# Patient Record
Sex: Male | Born: 1970 | Race: Black or African American | Hispanic: No | Smoking: Current every day smoker
Health system: Southern US, Community
[De-identification: ages and names within clinical notes are randomized; demographics above are authoritative.]

## PROBLEM LIST (undated history)

## (undated) DIAGNOSIS — I10 Essential (primary) hypertension: Secondary | ICD-10-CM

## (undated) DIAGNOSIS — N200 Calculus of kidney: Secondary | ICD-10-CM

## (undated) HISTORY — PX: TOE SURGERY: SHX1073

---

## 2019-04-19 ENCOUNTER — Ambulatory Visit: Payer: Self-pay | Attending: Internal Medicine

## 2019-04-19 DIAGNOSIS — Z23 Encounter for immunization: Secondary | ICD-10-CM

## 2019-04-19 NOTE — Progress Notes (Signed)
   Covid-19 Vaccination Clinic  Name:  Cosme Jacob    MRN: [id number] DOB: January 13, 1971  04/19/2019  Mr. Degroff was observed post Covid-19 immunization for 15 minutes without incident. He was provided with Vaccine Information Sheet and instruction to access the V-Safe system.   Mr. Rampey was instructed to call 911 with any severe reactions post vaccine: Marland Kitchen Difficulty breathing  . Swelling of face and throat  . A fast heartbeat  . A bad rash all over body  . Dizziness and weakness   Immunizations Administered    Name Date Dose VIS Date Route   Pfizer COVID-19 Vaccine 04/19/2019  9:24 AM 0.3 mL 01/12/2019 Intramuscular   Manufacturer: [employer], [employer]   Lot: [id number]   NDC: [address]-7

## 2019-05-16 ENCOUNTER — Ambulatory Visit: Payer: Self-pay | Attending: Internal Medicine

## 2019-05-16 DIAGNOSIS — Z23 Encounter for immunization: Secondary | ICD-10-CM

## 2019-05-16 NOTE — Progress Notes (Signed)
   Covid-19 Vaccination Clinic  Name:  Manuel Mosley    MRN: [id number] DOB: January 05, 1971  05/16/2019  [name] was observed post Covid-19 immunization for 15 minutes without incident. He was provided with Vaccine Information Sheet and instruction to access the V-Safe system.   [name] was instructed to call 911 with any severe reactions post vaccine: Marland Kitchen Difficulty breathing  . Swelling of face and throat  . A fast heartbeat  . A bad rash all over body  . Dizziness and weakness   Immunizations Administered    Name Date Dose VIS Date Route   Pfizer COVID-19 Vaccine 05/16/2019  3:52 PM 0.3 mL 01/12/2019 Intramuscular   Manufacturer: [employer], [employer]   Lot: [id number]   NDC: [address]-2

## 2019-07-19 ENCOUNTER — Other Ambulatory Visit: Payer: Self-pay

## 2019-07-19 ENCOUNTER — Observation Stay (HOSPITAL_COMMUNITY)
Admission: EM | Admit: 2019-07-19 | Discharge: 2019-07-21 | Disposition: A | Discharge status: Home / Self Care | Payer: Managed Care, Other (non HMO) | Attending: Internal Medicine | Admitting: Internal Medicine

## 2019-07-19 ENCOUNTER — Encounter (HOSPITAL_COMMUNITY): Payer: Self-pay

## 2019-07-19 ENCOUNTER — Emergency Department (HOSPITAL_COMMUNITY): Payer: Managed Care, Other (non HMO)

## 2019-07-19 ENCOUNTER — Ambulatory Visit (HOSPITAL_COMMUNITY)
Admission: EM | Admit: 2019-07-19 | Discharge: 2019-07-19 | Disposition: A | Discharge status: Other Acute Inpatient Hospital | Payer: Managed Care, Other (non HMO) | Source: Home / Self Care

## 2019-07-19 ENCOUNTER — Encounter (HOSPITAL_COMMUNITY): Payer: Self-pay | Admitting: Internal Medicine

## 2019-07-19 DIAGNOSIS — Z20822 Contact with and (suspected) exposure to covid-19: Secondary | ICD-10-CM | POA: Insufficient documentation

## 2019-07-19 DIAGNOSIS — I493 Ventricular premature depolarization: Secondary | ICD-10-CM | POA: Insufficient documentation

## 2019-07-19 DIAGNOSIS — I160 Hypertensive urgency: Principal | ICD-10-CM | POA: Diagnosis present

## 2019-07-19 DIAGNOSIS — R079 Chest pain, unspecified: Secondary | ICD-10-CM | POA: Diagnosis present

## 2019-07-19 DIAGNOSIS — I2510 Atherosclerotic heart disease of native coronary artery without angina pectoris: Secondary | ICD-10-CM | POA: Diagnosis not present

## 2019-07-19 DIAGNOSIS — F17210 Nicotine dependence, cigarettes, uncomplicated: Secondary | ICD-10-CM | POA: Insufficient documentation

## 2019-07-19 HISTORY — DX: Calculus of kidney: N20.0

## 2019-07-19 HISTORY — DX: Essential (primary) hypertension: I10

## 2019-07-19 LAB — CBC
HCT: 52 % (ref 39.0–52.0)
HCT: 49 % (ref 39.0–52.0)
Hemoglobin: 16.8 g/dL (ref 13.0–17.0)
Hemoglobin: 15.9 g/dL (ref 13.0–17.0)
MCH: 28.7 pg (ref 26.0–34.0)
MCH: 28.8 pg (ref 26.0–34.0)
MCHC: 32.3 g/dL (ref 30.0–36.0)
MCHC: 32.4 g/dL (ref 30.0–36.0)
MCV: 88.9 fL (ref 80.0–100.0)
MCV: 88.8 fL (ref 80.0–100.0)
Platelets: 237 10*3/uL (ref 150–400)
Platelets: 213 10*3/uL (ref 150–400)
RBC: 5.85 MIL/uL — ABNORMAL HIGH (ref 4.22–5.81)
RBC: 5.52 MIL/uL (ref 4.22–5.81)
RDW: 13.2 % (ref 11.5–15.5)
RDW: 13.3 % (ref 11.5–15.5)
WBC: 7.1 10*3/uL (ref 4.0–10.5)
WBC: 7.7 10*3/uL (ref 4.0–10.5)
nRBC: 0 % (ref 0.0–0.2)
nRBC: 0 % (ref 0.0–0.2)

## 2019-07-19 LAB — RAPID URINE DRUG SCREEN, HOSP PERFORMED
Amphetamines: NOT DETECTED
Barbiturates: NOT DETECTED
Benzodiazepines: NOT DETECTED
Cocaine: NOT DETECTED
Opiates: NOT DETECTED
Tetrahydrocannabinol: POSITIVE — AB

## 2019-07-19 LAB — BASIC METABOLIC PANEL
Anion gap: 9 (ref 5–15)
BUN: 8 mg/dL (ref 6–20)
CO2: 28 mmol/L (ref 22–32)
Calcium: 9.3 mg/dL (ref 8.9–10.3)
Chloride: 104 mmol/L (ref 98–111)
Creatinine, Ser: 0.95 mg/dL (ref 0.61–1.24)
GFR calc Af Amer: >60 mL/min (ref >60)
GFR calc non Af Amer: >60 mL/min (ref >60)
Glucose, Bld: 96 mg/dL (ref 70–99)
Potassium: 3.9 mmol/L (ref 3.5–5.1)
Sodium: 141 mmol/L (ref 135–145)

## 2019-07-19 LAB — TROPONIN I (HIGH SENSITIVITY)
Troponin I (High Sensitivity): 35 ng/L — ABNORMAL HIGH (ref <18)
Troponin I (High Sensitivity): 33 ng/L — ABNORMAL HIGH (ref <18)

## 2019-07-19 LAB — CREATININE, SERUM
Creatinine, Ser: 1.01 mg/dL (ref 0.61–1.24)
GFR calc Af Amer: >60 mL/min (ref >60)
GFR calc non Af Amer: >60 mL/min (ref >60)

## 2019-07-19 LAB — SARS CORONAVIRUS 2 BY RT PCR (HOSPITAL ORDER, PERFORMED IN ~~LOC~~ HOSPITAL LAB): SARS Coronavirus 2: NEGATIVE

## 2019-07-19 LAB — HIV ANTIBODY (ROUTINE TESTING W REFLEX): HIV Screen 4th Generation wRfx: NONREACTIVE

## 2019-07-19 MED ORDER — HYDRALAZINE HCL 20 MG/ML IJ SOLN
10.0000 mg | INTRAMUSCULAR | Status: DC | PRN
  Administered 2019-07-19 – 2019-07-20 (×2): 10 mg via INTRAVENOUS
  Filled 2019-07-19 (×2): qty 1

## 2019-07-19 MED ORDER — ACETAMINOPHEN 325 MG PO TABS: 650.0000 mg | ORAL_TABLET | ORAL | Status: DC | PRN

## 2019-07-19 MED ORDER — HYDROCHLOROTHIAZIDE 12.5 MG PO CAPS
12.5000 mg | ORAL_CAPSULE | Freq: Once | ORAL | Status: AC
  Administered 2019-07-19: 12.5 mg via ORAL
  Filled 2019-07-19: qty 1

## 2019-07-19 MED ORDER — ENOXAPARIN SODIUM 40 MG/0.4ML ~~LOC~~ SOLN
40.0000 mg | SUBCUTANEOUS | Status: DC
  Filled 2019-07-19: qty 0.4

## 2019-07-19 MED ORDER — ASPIRIN EC 81 MG PO TBEC
81.0000 mg | DELAYED_RELEASE_TABLET | Freq: Every day | ORAL | Status: DC
  Administered 2019-07-19 – 2019-07-21 (×3): 81 mg via ORAL
  Filled 2019-07-19 (×3): qty 1

## 2019-07-19 MED ORDER — AMLODIPINE BESYLATE 5 MG PO TABS
5.0000 mg | ORAL_TABLET | Freq: Every day | ORAL | Status: DC
  Administered 2019-07-19 – 2019-07-21 (×3): 5 mg via ORAL
  Filled 2019-07-19 (×3): qty 1

## 2019-07-19 MED ORDER — CHLORTHALIDONE 25 MG PO TABS
25.0000 mg | ORAL_TABLET | Freq: Every day | ORAL | Status: DC
  Administered 2019-07-20 – 2019-07-21 (×2): 25 mg via ORAL
  Filled 2019-07-19 (×2): qty 1

## 2019-07-19 MED ORDER — ONDANSETRON HCL 4 MG/2ML IJ SOLN: 4.0000 mg | Freq: Four times a day (QID) | INTRAMUSCULAR | Status: DC | PRN

## 2019-07-19 NOTE — ED Notes (Signed)
Patient is being discharged from the Urgent Care and sent to the Emergency Department via POV . Per Gloris Manchester, NP, patient is in need of higher level of care due to active chest pain. Patient is aware and verbalizes understanding of plan of care.  Vitals:   07/19/19 0945  BP: (!) 178/121  Pulse: 78  Resp: 16  Temp: 98.8 F (37.1 C)  SpO2: 100%

## 2019-07-19 NOTE — ED Triage Notes (Signed)
Pt c/o 5/10 non radiating chest heaviness in right and left chest that started at 0730 today. Pt c/o HA. Pt denies N/V, blurred vision, SOB. Pt has non labored breathing. Skin color WNL.

## 2019-07-19 NOTE — ED Triage Notes (Signed)
Pt sent here from UC for further eval of chest pain that developed as he was driving to work this morning. After arriving to work developed a headache. Denies n/v or shob. Pt reports he was sent here d/t his blood pressure and an abnormal EKG.

## 2019-07-19 NOTE — ED Notes (Signed)
Admitting at bedside 

## 2019-07-19 NOTE — ED Provider Notes (Signed)
Manuel Mos[facility] EMERGENCY DEPARTMENT Provider Note   CSN: [id number] Arrival date & time: 07/19/19  1002     History Chief Complaint  Patient presents with  . Chest Pain  . Abnormal ECG    Icholas Mosley is a 49 y.o. male.  HPI      Manuel Mosley is a 49 y.o. male, with a history of HTN, presenting to the ED with chest pain.  Patient states while driving to work around 1:65 AM this morning, he began to feel central chest pressure that lasted for approximately 25 minutes before resolving.  Pain was moderate, nonradiating. Shortly thereafter, he began to experience a headache that felt like a pressure, moderate to severe, nonradiating.  This also resolved spontaneously and did not recur. He notes he thinks his symptoms came on because of stress and his high blood pressure. He was previously on medication for his hypertension, however, has not been on any medications since 2019 due to loss of insurance.  He has an upcoming appointment with a PCP July 14.  Denies vision changes, neck pain, other neurologic deficits, syncope, dizziness, shortness of breath, diaphoresis, nausea/vomiting, abdominal pain, back pain, or any other complaints.     Past Medical History:  Diagnosis Date  . Hypertension   . Kidney stones     Patient Active Problem List   Diagnosis Date Noted  . Chest pain 07/19/2019    Past Surgical History:  Procedure Laterality Date  . TOE SURGERY Right        No family history on file.  Social History   Tobacco Use  . Smoking status: Current Every Day Smoker    Packs/day: 1.00    Types: Cigarettes  Substance Use Topics  . Alcohol use: Yes    Comment: occ  . Drug use: Yes    Types: Marijuana    Home Medications Prior to Admission medications   Not on File    Allergies    Patient has no known allergies.  Review of Systems   Review of Systems  Constitutional: Negative for diaphoresis.  Eyes: Negative for visual disturbance.    Respiratory: Negative for shortness of breath.   Cardiovascular: Positive for chest pain (resolved). Negative for palpitations.  Gastrointestinal: Negative for abdominal pain, diarrhea, nausea and vomiting.  Musculoskeletal: Negative for back pain.  Neurological: Positive for headaches (resolved). Negative for dizziness, syncope, weakness, light-headedness and numbness.  All other systems reviewed and are negative.   Physical Exam Updated Vital Signs BP (!) 163/119   Pulse 64   Temp 98.5 F (36.9 C) (Oral)   Resp 16   Ht 5\' 11"  (1.803 m)   Wt 106.1 kg   SpO2 100%   BMI 32.64 kg/m   Physical Exam Vitals and nursing note reviewed.  Constitutional:      General: He is not in acute distress.    Appearance: He is well-developed. He is not diaphoretic.  HENT:     Head: Normocephalic and atraumatic.     Mouth/Throat:     Mouth: Mucous membranes are moist.     Pharynx: Oropharynx is clear.  Eyes:     Conjunctiva/sclera: Conjunctivae normal.  Cardiovascular:     Rate and Rhythm: Normal rate and regular rhythm.     Pulses: Normal pulses.          Radial pulses are 2+ on the right side and 2+ on the left side.       Posterior tibial pulses are 2+ on the  right side and 2+ on the left side.     Heart sounds: Normal heart sounds.     Comments: Tactile temperature in the extremities appropriate and equal bilaterally. Pulmonary:     Effort: Pulmonary effort is normal. No respiratory distress.     Breath sounds: Normal breath sounds.  Abdominal:     Palpations: Abdomen is soft.     Tenderness: There is no abdominal tenderness. There is no guarding.  Musculoskeletal:     Cervical back: Neck supple.     Right lower leg: No edema.     Left lower leg: No edema.  Lymphadenopathy:     Cervical: No cervical adenopathy.  Skin:    General: Skin is warm and dry.  Neurological:     Mental Status: He is alert.  Psychiatric:        Mood and Affect: Mood and affect normal.        Speech:  Speech normal.        Behavior: Behavior normal.     ED Results / Procedures / Treatments   Labs (all labs ordered are listed, but only abnormal results are displayed) Labs Reviewed  CBC - Abnormal; Notable for the following components:      Result Value   RBC 5.85 (*)    All other components within normal limits  TROPONIN I (HIGH SENSITIVITY) - Abnormal; Notable for the following components:   Troponin I (High Sensitivity) 35 (*)    All other components within normal limits  TROPONIN I (HIGH SENSITIVITY) - Abnormal; Notable for the following components:   Troponin I (High Sensitivity) 33 (*)    All other components within normal limits  SARS CORONAVIRUS 2 BY RT PCR (HOSPITAL ORDER, [facility])  BASIC METABOLIC PANEL    EKG EKG Interpretation  Date/Time:  Thursday July 19 2019 10:00:21 EDT Ventricular Rate:  94 PR Interval:  166 QRS Duration: 90 QT Interval:  360 QTC Calculation: 450 R Axis:   -17 Text Interpretation: Sinus rhythm with sinus arrhythmia with occasional Premature ventricular complexes Otherwise normal ECG When compared to prior, similar PVCs present. No STEMI Confirmed by Antony Blackbird [phone]) on 07/19/2019 3:46:49 PM   Radiology DG Chest 2 View  Result Date: 07/19/2019 CLINICAL DATA:  Chest pain. EXAM: CHEST - 2 VIEW COMPARISON:  None. FINDINGS: The heart size and mediastinal contours are within normal limits. Both lungs are clear. No pneumothorax or pleural effusion is noted. The visualized skeletal structures are unremarkable. IMPRESSION: No active cardiopulmonary disease. Electronically Signed   By: Marijo Conception M.D.   On: 07/19/2019 10:43    Procedures Procedures (including critical care time)  Medications Ordered in ED Medications  hydrochlorothiazide (MICROZIDE) capsule 12.5 mg (12.5 mg Oral Given 07/19/19 1731)    ED Course  I have reviewed the triage vital signs and the nursing notes.  Pertinent labs & imaging  results that were available during my care of the patient were reviewed by me and considered in my medical decision making (see chart for details).  Clinical Course as of Jul 19 2023  Thu Jul 19, 2019  1802 Spoke with Dr. Margaretann Loveless, cardiologist.  Advises to admit the patient for observation through medicine service.  They will likely see the patient in the morning.  Continue to trend troponins and repeat EKGs overnight.   [SJ]  [place] with Dr. Hal Hope, hospitalist. Agrees to admit the patient.    [SJ]    Clinical Course User  Index [SJ] Bethenny Losee, Hillard Danker, PA-C   MDM Rules/Calculators/A&P                          Patient presents for evaluation following an episode of chest pain this morning. Patient is nontoxic appearing, afebrile, not tachycardic, not tachypneic, not hypotensive, maintains excellent SPO2 on room air, and is in no apparent distress.   I have reviewed the patient's chart to obtain more information.   I reviewed and interpreted the patient's labs and radiological studies. Patient has a mild troponin increase, flat across trended troponins. Mildly abnormal EKG without previous EKG with which to compare. Patient admitted for chest pain observation.  Findings and plan of care discussed with Theda Belfast, MD.   Vitals:   07/19/19 1159 07/19/19 1628 07/19/19 1732 07/19/19 1801  BP: (!) 163/119 (!) 178/130 (!) 152/107 (!) 154/98  Pulse: 64 77 92 64  Resp: 16 14 (!) 21 14  Temp:      TempSrc:      SpO2: 100% 96% 97% 98%  Weight:      Height:         Final Clinical Impression(s) / ED Diagnoses Final diagnoses:  Hypertensive urgency    Rx / DC Orders ED Discharge Orders    None       Concepcion Living 07/19/19 2030    Tegeler, Canary Brim, MD 07/20/19 0021

## 2019-07-19 NOTE — H&P (Addendum)
History and Physical    Manuel Mosley ERX:[id number] DOB: 1970-03-10 DOA: 07/19/2019  PCP: Patient, No Pcp Per  Patient coming from: Home.  Chief Complaint: Chest pain headache.  HPI: Manuel Mosley is a 49 y.o. male with history of hypertension tobacco abuse presents to the ER tech patient started having chest pain and headache this morning.  Patient stated the chest pain was retrosternal pressure-like lasted for 20 minutes resolved without any intervention.  At the same time patient also had a frontal headache for most however had to have which also resolved without any intervention.  Denies any shortness of breath productive cough fever chills or any focal deficits.  Patient states he has a history of hypertension and was on medication until 2 years ago when he lost his job and his insurance.  ED Course: In the ER EKG shows sinus rhythm high sensitive troponins were around 34 and 35.  ER physician discussed with on-call cardiology who recommended admission for observation.  Presently chest pain-free.  In addition patient blood pressure is found to be markedly elevated with systolic blood pressure of 1 82-3 116.  Patient was given hydrochlorothiazide and I have added amlodipine.  We will keep patient as needed IV hydralazine admitted for further management.  Covid test was negative.  Complete metabolic panel and CBC were unremarkable.  Review of Systems: As per HPI, rest all negative.   Past Medical History:  Diagnosis Date  . Hypertension   . Kidney stones     Past Surgical History:  Procedure Laterality Date  . TOE SURGERY Right      reports that he has been smoking cigarettes. He has been smoking about 1.00 pack per day. He has never used smokeless tobacco. He reports current alcohol use. He reports current drug use. Drug: Marijuana.  No Known Allergies  Family History  Problem Relation Age of Onset  . Breast cancer Mother   . Diabetes Mellitus II Mother   . Cancer Father      Prior to Admission medications   Not on File    Physical Exam: Constitutional: Moderately built and nourished. Vitals:   07/19/19 1916 07/19/19 2002 07/19/19 2100 07/19/19 2127  BP: (!) 170/127 (!) 163/121 (!) 170/111 (!) 170/111  Pulse: 69 61 72   Resp: (!) 23 16 17    Temp:      TempSrc:      SpO2: 98% 97% 95%   Weight:      Height:       Eyes: Anicteric no pallor. ENMT: No discharge from the ears eyes nose or mouth. Neck: No mass felt.  No neck rigidity. Respiratory: No rhonchi or crepitations. Cardiovascular: S1-S2 heard. Abdomen: Soft nontender bowel sounds present. Musculoskeletal: No edema. Skin: No rash. Neurologic: Alert awake oriented to time place and person.  Moves all extremities. Psychiatric: Appears normal.  Normal affect.   Labs on Admission: I have personally reviewed following labs and imaging studies  CBC: Recent Labs  Lab 07/19/19 1017  WBC 7.1  HGB 16.8  HCT 52.0  MCV 88.9  PLT 237   Basic Metabolic Panel: Recent Labs  Lab 07/19/19 1017  NA 141  K 3.9  CL 104  CO2 28  GLUCOSE 96  BUN 8  CREATININE 0.95  CALCIUM 9.3   GFR: Estimated Creatinine Clearance: 116.5 mL/min (by C-G formula based on SCr of 0.95 mg/dL). Liver Function Tests: No results for input(s): AST, ALT, ALKPHOS, BILITOT, PROT, ALBUMIN in the last 168 hours. No results  for input(s): LIPASE, AMYLASE in the last 168 hours. No results for input(s): AMMONIA in the last 168 hours. Coagulation Profile: No results for input(s): INR, PROTIME in the last 168 hours. Cardiac Enzymes: No results for input(s): CKTOTAL, CKMB, CKMBINDEX, TROPONINI in the last 168 hours. BNP (last 3 results) No results for input(s): PROBNP in the last 8760 hours. HbA1C: No results for input(s): HGBA1C in the last 72 hours. CBG: No results for input(s): GLUCAP in the last 168 hours. Lipid Profile: No results for input(s): CHOL, HDL, LDLCALC, TRIG, CHOLHDL, LDLDIRECT in the last 72  hours. Thyroid Function Tests: No results for input(s): TSH, T4TOTAL, FREET4, T3FREE, THYROIDAB in the last 72 hours. Anemia Panel: No results for input(s): VITAMINB12, FOLATE, FERRITIN, TIBC, IRON, RETICCTPCT in the last 72 hours. Urine analysis: No results found for: COLORURINE, APPEARANCEUR, LABSPEC, PHURINE, GLUCOSEU, HGBUR, BILIRUBINUR, KETONESUR, PROTEINUR, UROBILINOGEN, NITRITE, LEUKOCYTESUR Sepsis Labs: @LABRCNTIP (procalcitonin:4,lacticidven:4) ) Recent Results (from the past 240 hour(s))  SARS Coronavirus 2 by RT PCR (hospital order, performed in [facility] hospital lab) Nasopharyngeal Nasopharyngeal Swab     Status: None   Collection Time: 07/19/19  7:23 PM   Specimen: Nasopharyngeal Swab  Result Value Ref Range Status   SARS Coronavirus 2 NEGATIVE NEGATIVE Final    Comment: (NOTE) SARS-CoV-2 target nucleic acids are NOT DETECTED.  The SARS-CoV-2 RNA is generally detectable in upper and lower respiratory specimens during the acute phase of infection. The lowest concentration of SARS-CoV-2 viral copies this assay can detect is 250 copies / mL. A negative result does not preclude SARS-CoV-2 infection and should not be used as the sole basis for treatment or other patient management decisions.  A negative result may occur with improper specimen collection / handling, submission of specimen other than nasopharyngeal swab, presence of viral mutation(s) within the areas targeted by this assay, and inadequate number of viral copies (<250 copies / mL). A negative result must be combined with clinical observations, patient history, and epidemiological information.  Fact Sheet for Patients:   StrictlyIdeas.no  Fact Sheet for Healthcare Providers: BankingDealers.co.za  This test is not yet approved or  cleared by the [place] FDA and has been authorized for detection and/or diagnosis of SARS-CoV-2 by FDA under an Emergency Use  Authorization (EUA).  This EUA will remain in effect (meaning this test can be used) for the duration of the COVID-19 declaration under Section 564(b)(1) of the Act, 21 U.S.C. section 360bbb-3(b)(1), unless the authorization is terminated or revoked sooner.  Performed at [facility], [place] [address], [place], [place] [address]      Radiological Exams on Admission: DG Chest 2 View  Result Date: 07/19/2019 CLINICAL DATA:  Chest pain. EXAM: CHEST - 2 VIEW COMPARISON:  None. FINDINGS: The heart size and mediastinal contours are within normal limits. Both lungs are clear. No pneumothorax or pleural effusion is noted. The visualized skeletal structures are unremarkable. IMPRESSION: No active cardiopulmonary disease. Electronically Signed   By: Marijo Conception M.D.   On: 07/19/2019 10:43    EKG: Independently reviewed.  Normal sinus rhythm.  PVCs.  Assessment/Plan Principal Problem:   Chest pain Active Problems:   Hypertensive urgency    1. Chest pain -presently chest pain-free.  Could be due to hypertensive urgency.  However patient does have risk factors including uncontrolled hypertension and tobacco abuse.  Will admit for further observation consult cardiology keep patient on aspirin keep n.p.o. in anticipation of possible procedure in a.m. 2. Hypertensive urgency with start  patient on chlorthalidone and amlodipine for now.  As needed IV hydralazine. 3. Tobacco abuse strongly advised about quitting. 4. Patient had headache which resolved after blood pressure improvement.  Appears nonfocal.  Closely monitor.   DVT prophylaxis: Lovenox. Code Status: Full code. Family Communication: Discussed with patient. Disposition Plan: Home. Consults called: Cardiology. Admission status: Observation.   Eduard Clos MD [facility] Pager [phone].  If 7PM-7AM, please contact night-coverage [email or web address] Password [facility]  07/19/2019, 9:50 PM

## 2019-07-20 ENCOUNTER — Observation Stay (HOSPITAL_COMMUNITY): Payer: Managed Care, Other (non HMO)

## 2019-07-20 ENCOUNTER — Observation Stay (HOSPITAL_BASED_OUTPATIENT_CLINIC_OR_DEPARTMENT_OTHER): Payer: Managed Care, Other (non HMO)

## 2019-07-20 DIAGNOSIS — R079 Chest pain, unspecified: Secondary | ICD-10-CM

## 2019-07-20 DIAGNOSIS — I2510 Atherosclerotic heart disease of native coronary artery without angina pectoris: Secondary | ICD-10-CM

## 2019-07-20 LAB — ECHOCARDIOGRAM COMPLETE
Height: 71 in
Weight: 3718.4 oz

## 2019-07-20 MED ORDER — METOPROLOL TARTRATE 25 MG PO TABS
25.0000 mg | ORAL_TABLET | Freq: Two times a day (BID) | ORAL | Status: DC
  Administered 2019-07-20 – 2019-07-21 (×3): 25 mg via ORAL
  Filled 2019-07-20 (×3): qty 1

## 2019-07-20 MED ORDER — METOPROLOL TARTRATE 5 MG/5ML IV SOLN: 5.0000 mg | INTRAVENOUS | Status: DC | PRN

## 2019-07-20 MED ORDER — IOHEXOL 350 MG/ML SOLN
80.0000 mL | Freq: Once | INTRAVENOUS | Status: AC | PRN
  Administered 2019-07-20: 80 mL via INTRAVENOUS

## 2019-07-20 MED ORDER — NITROGLYCERIN 0.4 MG SL SUBL
0.8000 mg | SUBLINGUAL_TABLET | Freq: Once | SUBLINGUAL | Status: AC
  Administered 2019-07-20: 0.8 mg via SUBLINGUAL

## 2019-07-20 MED ORDER — NITROGLYCERIN 0.4 MG SL SUBL
SUBLINGUAL_TABLET | SUBLINGUAL | Status: AC
  Filled 2019-07-20: qty 2

## 2019-07-20 MED ORDER — POTASSIUM CHLORIDE CRYS ER 20 MEQ PO TBCR
20.0000 meq | EXTENDED_RELEASE_TABLET | Freq: Once | ORAL | Status: AC
  Administered 2019-07-20: 20 meq via ORAL
  Filled 2019-07-20: qty 1

## 2019-07-20 NOTE — Progress Notes (Signed)
Echocardiogram 2D Echocardiogram has been performed.  Manuel Mosley 07/20/2019, 2:55 PM

## 2019-07-20 NOTE — Progress Notes (Signed)
PROGRESS NOTE    Manuel Mosley  FYB:[id number] DOB: 1971-01-01 DOA: 07/19/2019 PCP: Patient, No Pcp Per    Brief Narrative:Manuel Mosley is a 48 y.o. male with history of hypertension tobacco abuse presents to the ER tech patient started having chest pain and headache this morning.  Patient stated the chest pain was retrosternal pressure-like lasted for 20 minutes resolved without any intervention.  At the same time patient also had a frontal headache for most however had to have which also resolved without any intervention.  Denies any shortness of breath productive cough fever chills or any focal deficits.  Patient states he has a history of hypertension and was on medication until 2 years ago when he lost his job and his insurance.  ED Course: In the ER EKG shows sinus rhythm high sensitive troponins were around 34 and 35.  ER physician discussed with on-call cardiology who recommended admission for observation.  Presently chest pain-free.  In addition patient blood pressure is found to be markedly elevated with systolic blood pressure of 1 82-3 116.  Patient was given hydrochlorothiazide and I have added amlodipine.  We will keep patient as needed IV hydralazine admitted for further management.  Covid test was negative.  Complete metabolic panel and CBC were unremarkable.  Assessment & Plan:   Principal Problem:   Chest pain Active Problems:   Hypertensive urgency    #1 chest pain secondary to uncontrolled hypertension.  Patient admitted with chest pain that lasted for 30 minutes or so and resolved on its own associated with headache.  He has not been taking his antihypertensives since Covid came and he lost his insurance and job. Cardiology consulted patient scheduled to have a cardiac CT n.p.o.   #2 hypertensive urgency blood pressure still elevated 158/109  on Norvasc metoprolol and HCTZ..  Cardiology following.  #3 tobacco abuse patient is interested in quitting.   Estimated body  mass index is 32.41 kg/m as calculated from the following:   Height as of this encounter: 5\' 11"  (1.803 m).   Weight as of this encounter: 105.4 kg.  DVT prophylaxis: Lovenox Code Status: Full code Family Communication: Discussed with patient Disposition Plan:  Status is: Observation  Dispo: The patient is from: home              Anticipated d/c is              Anticipated d/c date is:6/19              Patient currently is not medically stable to d/c.  Patient admitted with chest pain scheduled for CT coronary Consultants:  cardiology  Procedures: none Antimicrobials: none  Subjective: Denies chest pain headache nausea vomiting Objective: Vitals:   07/20/19 0550 07/20/19 0724 07/20/19 1201 07/20/19 1224  BP: (!) 160/100 (!) 156/100 (!) 158/109   Pulse:  77 (!) 58 71  Resp:  20 20   Temp:  97.9 F (36.6 C) (!) 97.5 F (36.4 C)   TempSrc:  Oral Oral   SpO2:  98% 96%   Weight:      Height:        Intake/Output Summary (Last 24 hours) at 07/20/2019 1434 Last data filed at 07/20/2019 0450 Gross per 24 hour  Intake --  Output 225 ml  Net -225 ml   Filed Weights   07/19/19 1007 07/20/19 0450  Weight: 106.1 kg 105.4 kg    Examination:  General exam: Appears calm and comfortable  Respiratory system: Clear to  auscultation. Respiratory effort normal. Cardiovascular system: S1 & S2 heard, RRR. No JVD, murmurs, rubs, gallops or clicks. No pedal edema. Gastrointestinal system: Abdomen is nondistended, soft and nontender. No organomegaly or masses felt. Normal bowel sounds heard. Central nervous system: Alert and oriented. No focal neurological deficits. Extremities: Symmetric 5 x 5 power. Skin: No rashes, lesions or ulcers Psychiatry: Judgement and insight appear normal. Mood & affect appropriate.     Data Reviewed: I have personally reviewed following labs and imaging studies  CBC: Recent Labs  Lab 07/19/19 1017 07/19/19 2230  WBC 7.1 7.7  HGB 16.8 15.9   HCT 52.0 49.0  MCV 88.9 88.8  PLT 237 213   Basic Metabolic Panel: Recent Labs  Lab 07/19/19 1017 07/19/19 2230  NA 141  --   K 3.9  --   CL 104  --   CO2 28  --   GLUCOSE 96  --   BUN 8  --   CREATININE 0.95 1.01  CALCIUM 9.3  --    GFR: Estimated Creatinine Clearance: 109.2 mL/min (by C-G formula based on SCr of 1.01 mg/dL). Liver Function Tests: No results for input(s): AST, ALT, ALKPHOS, BILITOT, PROT, ALBUMIN in the last 168 hours. No results for input(s): LIPASE, AMYLASE in the last 168 hours. No results for input(s): AMMONIA in the last 168 hours. Coagulation Profile: No results for input(s): INR, PROTIME in the last 168 hours. Cardiac Enzymes: No results for input(s): CKTOTAL, CKMB, CKMBINDEX, TROPONINI in the last 168 hours. BNP (last 3 results) No results for input(s): PROBNP in the last 8760 hours. HbA1C: No results for input(s): HGBA1C in the last 72 hours. CBG: No results for input(s): GLUCAP in the last 168 hours. Lipid Profile: No results for input(s): CHOL, HDL, LDLCALC, TRIG, CHOLHDL, LDLDIRECT in the last 72 hours. Thyroid Function Tests: No results for input(s): TSH, T4TOTAL, FREET4, T3FREE, THYROIDAB in the last 72 hours. Anemia Panel: No results for input(s): VITAMINB12, FOLATE, FERRITIN, TIBC, IRON, RETICCTPCT in the last 72 hours. Sepsis Labs: No results for input(s): PROCALCITON, LATICACIDVEN in the last 168 hours.  Recent Results (from the past 240 hour(s))  SARS Coronavirus 2 by RT PCR (hospital order, performed in [facility] hospital lab) Nasopharyngeal Nasopharyngeal Swab     Status: None   Collection Time: 07/19/19  7:23 PM   Specimen: Nasopharyngeal Swab  Result Value Ref Range Status   SARS Coronavirus 2 NEGATIVE NEGATIVE Final    Comment: (NOTE) SARS-CoV-2 target nucleic acids are NOT DETECTED.  The SARS-CoV-2 RNA is generally detectable in upper and lower respiratory specimens during the acute phase of infection. The  lowest concentration of SARS-CoV-2 viral copies this assay can detect is 250 copies / mL. A negative result does not preclude SARS-CoV-2 infection and should not be used as the sole basis for treatment or other patient management decisions.  A negative result may occur with improper specimen collection / handling, submission of specimen other than nasopharyngeal swab, presence of viral mutation(s) within the areas targeted by this assay, and inadequate number of viral copies (<250 copies / mL). A negative result must be combined with clinical observations, patient history, and epidemiological information.  Fact Sheet for Patients:   BoilerBrush.com.cy  Fact Sheet for Healthcare Providers: https://pope.com/  This test is not yet approved or  cleared by the [place] FDA and has been authorized for detection and/or diagnosis of SARS-CoV-2 by FDA under an Emergency Use Authorization (EUA).  This EUA will remain in effect (meaning  this test can be used) for the duration of the COVID-19 declaration under Section 564(b)(1) of the Act, 21 U.S.C. section 360bbb-3(b)(1), unless the authorization is terminated or revoked sooner.  Performed at [facility], [address] [place], [place] [address]          Radiology Studies: DG Chest 2 View  Result Date: 07/19/2019 CLINICAL DATA:  Chest pain. EXAM: CHEST - 2 VIEW COMPARISON:  None. FINDINGS: The heart size and mediastinal contours are within normal limits. Both lungs are clear. No pneumothorax or pleural effusion is noted. The visualized skeletal structures are unremarkable. IMPRESSION: No active cardiopulmonary disease. Electronically Signed   By: Marijo Conception M.D.   On: 07/19/2019 10:43        Scheduled Meds:  amLODipine  5 mg Oral Daily   aspirin EC  81 mg Oral Daily   chlorthalidone  25 mg Oral Daily   enoxaparin (LOVENOX) injection  40 mg Subcutaneous Q24H    metoprolol tartrate  25 mg Oral BID   Continuous Infusions:   LOS: 0 days     Georgette Shell, MD  07/20/2019, 2:34 PM

## 2019-07-20 NOTE — Plan of Care (Signed)

## 2019-07-20 NOTE — Consult Note (Signed)
Cardiology Consultation:   Patient ID: Manuel Mosley MRN: [id number]; DOB: 07-11-1970  Admit date: 07/19/2019 Date of Consult: 07/20/2019  Primary Care Provider: Patient, No Pcp Per [facility] Cardiologist: No primary care provider on file. New Dr. Jacques [facility] Electrophysiologist:  None    Patient Profile:   Manuel Mosley is a 49 y.o. male with a hx of tobacco use and HTN who is being seen today for the evaluation of chest pain and abnormal EKG at the request of Dr. Jerolyn Center.  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  He has intermittent chest pain that he has noticed previously with exertion while exercising, but on a few occasions has had it just at rest.  He presented to the hospital with hypertensive urgency with chest pain and headache.  Troponin was mildly abnormal at 35>33, and ECG showed T wave inversions in V4 through V6 and PVCs.  Blood pressure on presentation was 180/135 and remained persistently elevated for the majority of the day yesterday.  He is a current smoker approximately a half a pack per day for the past 10 years.  He has a history of significant hypertension for which he used to take medication therapy.  At the time of the COVID-19 pandemic last year he lost his job and therefore lost his health insurance and was unable to continue medical therapy.  He has had high blood pressure for many years and is aware of his symptoms of severely elevated blood pressure which include headache.  He has not previously experienced chest discomfort with his hypertensive episodes and feels that this is a new symptom.  He is now working again.  Denies dyspnea on exertion, palpitations, PND, orthopnea, leg swelling.  Denies syncope or presyncope, lightheadedness.  No significant family history of MI, sudden cardiac death, or premature CAD  though his mother has congestive heart failure.  Pertinent laboratory studies include sodium 141, potassium 3.9, creatinine 0.95 hemoglobin 16.8, platelet count 237,000, WBCs 7.1.  Chest x-ray unremarkable.  Telemetry demonstrates sinus rhythm.  ECG with sinus rhythm and T wave inversions V 4 through V6 and PVCs.   Past Medical History:  Diagnosis Date  . Hypertension   . Kidney stones     Past Surgical History:  Procedure Laterality Date  . TOE SURGERY Right      Home Medications:  Prior to Admission medications   Not on File    Inpatient Medications: Scheduled Meds: . amLODipine  5 mg Oral Daily  . aspirin EC  81 mg Oral Daily  . chlorthalidone  25 mg Oral Daily  . enoxaparin (LOVENOX) injection  40 mg Subcutaneous Q24H   Continuous Infusions:  PRN Meds: acetaminophen, hydrALAZINE, ondansetron (ZOFRAN) IV  Allergies:   No Known Allergies  Social History:   Social History   Socioeconomic History  . Marital status: Significant Other    Spouse name: Not on file  . Number of children: Not on file  . Years of education: Not on file  . Highest education level: Not on file  Occupational History  . Not on file  Tobacco Use  . Smoking status: Current Every Day Smoker    Packs/day: 1.00    Types: Cigarettes  . Smokeless tobacco: Never Used  Substance and Sexual Activity  . Alcohol use: Yes    Comment: occ  . Drug use: Yes  Types: Marijuana  . Sexual activity: Not on file  Other Topics Concern  . Not on file  Social History Narrative  . Not on file   Social Determinants of Health   Financial Resource Strain:   . Difficulty of Paying Living Expenses:   Food Insecurity:   . Worried About Programme researcher, broadcasting/film/video in the Last Year:   . Barista in the Last Year:   Transportation Needs:   . Freight forwarder (Medical):   Marland Kitchen Lack of Transportation (Non-Medical):   Physical Activity:   . Days of Exercise per Week:   . Minutes of Exercise per  Session:   Stress:   . Feeling of Stress :   Social Connections:   . Frequency of Communication with Friends and Family:   . Frequency of Social Gatherings with Friends and Family:   . Attends Religious Services:   . Active Member of Clubs or Organizations:   . Attends Banker Meetings:   Marland Kitchen Marital Status:   Intimate Partner Violence:   . Fear of Current or Ex-Partner:   . Emotionally Abused:   Marland Kitchen Physically Abused:   . Sexually Abused:     Family History:    Family History  Problem Relation Age of Onset  . Breast cancer Mother   . Diabetes Mellitus II Mother   . Cancer Father      ROS:  Please see the history of present illness.   All other ROS reviewed and negative.     Physical Exam/Data:   Vitals:   07/20/19 0445 07/20/19 0450 07/20/19 0550 07/20/19 0724  BP: (!) 152/116  (!) 160/100 (!) 156/100  Pulse: 78   77  Resp: 18   20  Temp: 98.3 F (36.8 C)   97.9 F (36.6 C)  TempSrc: Oral   Oral  SpO2: 98%   98%  Weight:  105.4 kg    Height:        Intake/Output Summary (Last 24 hours) at 07/20/2019 1053 Last data filed at 07/20/2019 0450 Gross per 24 hour  Intake --  Output 225 ml  Net -225 ml   Last 3 Weights 07/20/2019 07/19/2019 07/19/2019  Weight (lbs) 232 lb 6.4 oz 234 lb 234 lb  Weight (kg) 105.416 kg 106.142 kg 106.142 kg     Body mass index is 32.41 kg/m.  Constitutional: No acute distress Eyes: sclera non-icteric, normal conjunctiva and lids ENMT: normal dentition, moist mucous membranes Cardiovascular: regular rhythm, normal rate, no murmurs. S1 and S2 normal. Radial pulses normal bilaterally. No jugular venous distention.  Respiratory: clear to auscultation bilaterally GI : normal bowel sounds, soft and nontender. No distention.   MSK: extremities warm, well perfused. No edema.  NEURO: grossly nonfocal exam, moves all extremities. PSYCH: alert and oriented x 3, normal mood and affect.    Relevant CV Studies: Echo pending at time  of consult CCTA pending  Laboratory Data:  High Sensitivity Troponin:   Recent Labs  Lab 07/19/19 1017 07/19/19 1629  TROPONINIHS 35* 33*     Chemistry Recent Labs  Lab 07/19/19 1017 07/19/19 2230  NA 141  --   K 3.9  --   CL 104  --   CO2 28  --   GLUCOSE 96  --   BUN 8  --   CREATININE 0.95 1.01  CALCIUM 9.3  --   GFRNONAA >60 >60  GFRAA >60 >60  ANIONGAP 9  --     No  results for input(s): PROT, ALBUMIN, AST, ALT, ALKPHOS, BILITOT in the last 168 hours. Hematology Recent Labs  Lab 07/19/19 1017 07/19/19 2230  WBC 7.1 7.7  RBC 5.85* 5.52  HGB 16.8 15.9  HCT 52.0 49.0  MCV 88.9 88.8  MCH 28.7 28.8  MCHC 32.3 32.4  RDW 13.2 13.3  PLT 237 213   BNPNo results for input(s): BNP, PROBNP in the last 168 hours.  DDimer No results for input(s): DDIMER in the last 168 hours.   Radiology/Studies:  DG Chest 2 View  Result Date: 07/19/2019 CLINICAL DATA:  Chest pain. EXAM: CHEST - 2 VIEW COMPARISON:  None. FINDINGS: The heart size and mediastinal contours are within normal limits. Both lungs are clear. No pneumothorax or pleural effusion is noted. The visualized skeletal structures are unremarkable. IMPRESSION: No active cardiopulmonary disease. Electronically Signed   By: Marijo Conception M.D.   On: 07/19/2019 10:43   { Assessment and Plan:   #1 hypertensive urgency #2 chest pain  He has had a concerning episode of chest pain at rest in the setting of severely elevated blood pressure.  He notes chest pain in the setting of hypertension is a new symptom for him, with a mildly elevated troponin and an abnormal ECG that has since normalized.  While this could all be effects of demand from hypertensive urgency, he has risk factors for coronary disease of of hypertension and smoking.  Therefore we will need to perform an ischemic evaluation.  I have thoroughly discussed the options with the patient including outpatient management, inpatient coronary CTA, inpatient nuclear  study, and conventional coronary angiography.  The patient and I participated in shared decision making and determined that coronary CTA is likely the next best test, we will perform this today.  He may need additional beta-blocker for his PVCs, we will initiate beta-blockade at this time. -Primary service has already initiated amlodipine at 5 mg daily and chlorthalidone 25 mg daily, agree. -For ectopy and benefit of his heart rate for CCTA, I will also start metoprolol tartrate 25 mg twice daily. -It is highly likely will need an additional agent, would consider ARB at follow-up.  I have independently interpreted his echocardiogram which demonstrates no evidence of coarctation of the aorta.  CCTA pending.       For questions or updates, please contact [place] Please consult [email or web address] for contact info under    Signed, Elouise Munroe, MD  07/20/2019 10:53 AM

## 2019-07-21 DIAGNOSIS — I160 Hypertensive urgency: Secondary | ICD-10-CM | POA: Diagnosis not present

## 2019-07-21 DIAGNOSIS — I2510 Atherosclerotic heart disease of native coronary artery without angina pectoris: Secondary | ICD-10-CM | POA: Diagnosis not present

## 2019-07-21 DIAGNOSIS — R079 Chest pain, unspecified: Secondary | ICD-10-CM | POA: Diagnosis not present

## 2019-07-21 MED ORDER — CHLORTHALIDONE 25 MG PO TABS: 25.0000 mg | ORAL_TABLET | Freq: Every day | ORAL | 4 refills | Status: AC

## 2019-07-21 MED ORDER — ALUM & MAG HYDROXIDE-SIMETH 200-200-20 MG/5ML PO SUSP: 30.0000 mL | ORAL | Status: DC | PRN

## 2019-07-21 MED ORDER — ROSUVASTATIN CALCIUM 20 MG PO TABS
40.0000 mg | ORAL_TABLET | Freq: Every day | ORAL | 11 refills | Status: DC
Start: 2019-07-21 — End: 2019-08-09

## 2019-07-21 MED ORDER — ASPIRIN 81 MG PO TBEC: 81.0000 mg | DELAYED_RELEASE_TABLET | Freq: Every day | ORAL | 11 refills | Status: AC

## 2019-07-21 MED ORDER — AMLODIPINE BESYLATE 5 MG PO TABS: 10.0000 mg | ORAL_TABLET | Freq: Every day | ORAL | 4 refills | Status: AC

## 2019-07-21 MED ORDER — CARVEDILOL 12.5 MG PO TABS
12.5000 mg | ORAL_TABLET | Freq: Two times a day (BID) | ORAL | 11 refills | Status: DC
Start: 2019-07-21 — End: 2019-08-09

## 2019-07-21 MED ORDER — AMLODIPINE BESYLATE 5 MG PO TABS: 5.0000 mg | ORAL_TABLET | Freq: Every day | ORAL | 4 refills | Status: DC

## 2019-07-21 NOTE — Discharge Summary (Signed)
Physician Discharge Summary  Manuel Mosley WUJ:[id number] DOB: July 15, 1970 DOA: 07/19/2019  PCP: Patient, No Pcp Per  Admit date: 07/19/2019 Discharge date: 07/21/2019  Admitted From:home Disposition: home Recommendations for Outpatient Follow-up:  1. Follow up with PCP in 1-2 weeks 2. Please obtain BMP/CBC in one week 3. Please check lipid panel with the next visit  Home Health none Equipment/Devices: None Discharge Condition stable CODE STATUS: Full code Diet recommendation: Cardiac Brief/Interim Summary:Manuel Mosley a 49 y.o.malewithhistory of hypertension tobacco abuse presents to the ER tech patient started having chest pain and headache this morning. Patient stated the chest pain was retrosternal pressure-like lasted for 20 minutes resolved without any intervention. At the same time patient also had a frontal headache for most however had to have which also resolved without any intervention. Denies any shortness of breath productive cough fever chills or any focal deficits. Patient states he has a history of hypertension and was on medication until 2 years ago when he lost his job and his insurance.  ED Course:In the ER EKG shows sinus rhythm high sensitive troponins were around 34 and 35. ER physician discussed with on-call cardiology who recommended admission for observation. Presently chest pain-free. In addition patient blood pressure is found to be markedly elevated with systolic blood pressure of 1 82-3 116. Patient was given hydrochlorothiazide and I have added amlodipine. We will keep patient as needed IV hydralazine admitted for further management. Covid test was negative. Complete metabolic panel and CBC were unremarkable   Discharge Diagnoses:  Principal Problem:   Chest pain Active Problems:   Hypertensive urgency  #1 chest pain secondary to uncontrolled hypertension.  Coronary CT scan-coronary calcium score 270 normal coronary origin with right  dominance moderate stenosis in the proximal and mid LAD ostial portion of OM1 and distal RCA recommending symptom guided anti-ischemic therapy as well as risk factor modification.  CT FFR pending.  Adding Crestor to his discharge regime.  #2 hypertensive urgency increase Norvasc to 10 mg daily, continue HCTZ, metoprolol changed to Coreg per cardiology recommendations on discharge.    #3 tobacco abuse patient is interested in quitting.  Advised him to quit.   Estimated body mass index is 31.84 kg/m as calculated from the following:   Height as of this encounter:  (1.803 m).   Weight as of this encounter: 103.6 kg.  Discharge Instructions  Discharge Instructions    Diet - low sodium heart healthy   Complete by: As directed    Increase activity slowly   Complete by: As directed      Allergies as of 07/21/2019   No Known Allergies     Medication List    TAKE these medications   amLODipine 5 MG tablet Commonly known as: NORVASC Take 2 tablets (10 mg total) by mouth daily. Start taking on: July 22, 2019   aspirin 81 MG EC tablet Take 1 tablet (81 mg total) by mouth daily. Swallow whole. Start taking on: July 22, 2019   carvedilol 12.5 MG tablet Commonly known as: Coreg Take 1 tablet (12.5 mg total) by mouth 2 (two) times daily.   chlorthalidone 25 MG tablet Commonly known as: HYGROTON Take 1 tablet (25 mg total) by mouth daily. Start taking on: July 22, 2019   rosuvastatin 20 MG tablet Commonly known as: Crestor Take 2 tablets (40 mg total) by mouth daily.       Follow-up Information    Parke Poisson, MD Follow up.   Specialties: Cardiology, Radiology Why: [facility]  follow-up scheduled for 08/08/2019 at 4:00pm. Please arrive 15 minutes early for check-in. If this date/time does not work for you, please call our office to reschedule. Contact information: [address] [place] [address] [phone]              No Known  Allergies  Consultations: Cardiology  Procedures/Studies: DG Chest 2 View  Result Date: 07/19/2019 CLINICAL DATA:  Chest pain. EXAM: CHEST - 2 VIEW COMPARISON:  None. FINDINGS: The heart size and mediastinal contours are within normal limits. Both lungs are clear. No pneumothorax or pleural effusion is noted. The visualized skeletal structures are unremarkable. IMPRESSION: No active cardiopulmonary disease. Electronically Signed   By: Lupita Raider M.D.   On: 07/19/2019 10:43   CT CORONARY MORPH W/CTA COR W/SCORE W/CA W/CM &/OR WO/CM  Result Date: 07/20/2019 CLINICAL DATA:  49 year old male with h/o smoking who presented with hypertensive urgency, mildly elevated troponin and chest pain. EXAM: Cardiac/Coronary  CTA TECHNIQUE: The patient was scanned on a [employer]. FINDINGS: A 100 kV prospective scan was triggered in the descending thoracic aorta at 111 HU's. Axial non-contrast 3 mm slices were carried out through the heart. The data set was analyzed on a dedicated work station and scored using the Agatson method. Gantry rotation speed was 250 msecs and collimation was .6 mm. 25 mg of PO Metoprolol and 0.8 mg of sl NTG were given. The 3D data set was reconstructed in 5% intervals of the 67-82 % of the R-R cycle. Diastolic phases were analyzed on a dedicated work station using MPR, MIP and VRT modes. The patient received 80 cc of contrast. Aorta: Mild ascending aortic aneurysm measuring 44 mm. Trvial atherosclerotic plaque, no calcifications, no dissection. Aortic Valve:  Trileaflet.  No calcifications. Coronary Arteries:  Normal coronary origin.  Right dominance. RCA is a medium size dominant artery that gives rise to PDA, PLA is not visualized. There is mild diffuse mixed plaque in the mid and distal RCA with stenosis 25-49% and a focal stenosis of 50-69% in the distal portion. Left main is a large artery that gives rise to LAD and LCX arteries. Left main has no plaque. LAD is a large  vessel that has moderate mixed plaque in the proximal portion with stenosis 50-69% and another focal moderate non-calcified plaque in the mid LAD with stenosis 50-69%. LAD gives rise to one diagonal artery that appears occluded in its proximal portion. LCX is a small non-dominant artery that gives rise to one large OM1 branch. OM1 has a moderate calcified plaque in the ostial/proximal portion with stenosis 50-69%. Other findings: Normal pulmonary vein drainage into the left atrium. Normal left atrial appendage without a thrombus. Normal size of the pulmonary artery. IMPRESSION: 1. Coronary calcium score of 270. This was 5 percentile for age and sex matched control. 2. Normal coronary origin with right dominance. 3. CAD-RADS 3. Moderate stenosis in the proximal and mid LAD, ostial portion of OM1 and distal RCA. Consider symptom-guided anti-ischemic pharmacotherapy as well as risk factor modification per guideline directed care. Additional analysis with CT FFR will be submitted. Electronically Signed   By: Tobias Alexander   On: 07/20/2019 17:14   CT CORONARY FRACTIONAL FLOW RESERVE FLUID ANALYSIS  Result Date: 07/21/2019 EXAM: CT FFR ANALYSIS CLINICAL DATA:  49 year old male with abnormal CCTA. FINDINGS: FFRct analysis was performed on the original cardiac CT angiogram dataset. Diagrammatic representation of the FFRct analysis is provided in a separate PDF document in PACS. This  dictation was created using the PDF document and an interactive 3D model of the results. 3D model is not available in the EMR/PACS. Normal FFR range is >0.80. 1. Left Main: 1.0. 2. LAD: Proximal: 0.97, mid: 0.95, distal; 0.91. 3. Small D1: Occluded in its proximal portion. 4. LCX: Proximal: 0.97, distal: 0.76. 5. OM1: 0.95. 6. RCA: Proximal: 0.95, mid: 0.86, distal: 0.82. IMPRESSION: 1. CT FFR analysis showed diffuse moderate disease with severe disease in a small lumen distal portion of non-dominant LCX artery and proximal portion of a  small diagonal artery. Aggressive medical management is indicated. Electronically Signed   By: Tobias Alexander   On: 07/21/2019 11:13   ECHOCARDIOGRAM COMPLETE  Result Date: 07/20/2019    ECHOCARDIOGRAM REPORT   Patient Name:   MASIYAH Turck Date of Exam: 07/20/2019 Medical Rec #:  [id number]     Height:       71.0 in Accession #:    [id number]    Weight:       232.4 lb Date of Birth:  [birth year]     BSA:          2.247 m Patient Age:    49 years      BP:           158/109 mmHg Patient Gender: M             HR:           71 bpm. Exam Location:  Inpatient Procedure: 2D Echo, Color Doppler and Cardiac Doppler Indications:    R07.9* Chest pain, unspecified; R94.31 Abnormal EKG; Elevated                 Troponins  History:        Patient has no prior history of Echocardiogram examinations.                 Risk Factors:Hypertension.  Sonographer:    Irving Burton Senior RDCS Referring Phys: [id number] Parke Poisson IMPRESSIONS  1. Left ventricular ejection fraction, by estimation, is 60 to 65%. The left ventricle has normal function. Left ventricular endocardial border not optimally defined to evaluate regional wall motion. There is mild left ventricular hypertrophy. Left ventricular diastolic parameters were normal.  2. Right ventricular systolic function is normal. The right ventricular size is normal. Tricuspid regurgitation signal is inadequate for assessing PA pressure.  3. The mitral valve is normal in structure. Trivial mitral valve regurgitation. No evidence of mitral stenosis.  4. The aortic valve is normal in structure. Aortic valve regurgitation is trivial. No aortic stenosis is present.  5. The inferior vena cava is normal in size with greater than 50% respiratory variability, suggesting right atrial pressure of 3 mmHg.  6. No evidence of coarctation of the aorta. FINDINGS  Left Ventricle: Left ventricular ejection fraction, by estimation, is 60 to 65%. The left ventricle has normal function. Left ventricular  endocardial border not optimally defined to evaluate regional wall motion. The left ventricular internal cavity size was normal in size. There is mild left ventricular hypertrophy. Left ventricular diastolic parameters were normal. Right Ventricle: The right ventricular size is normal. No increase in right ventricular wall thickness. Right ventricular systolic function is normal. Tricuspid regurgitation signal is inadequate for assessing PA pressure. Left Atrium: Left atrial size was normal in size. Right Atrium: Right atrial size was normal in size. Pericardium: There is no evidence of pericardial effusion. Presence of pericardial fat pad. Mitral Valve: The mitral valve is normal in structure. Normal  mobility of the mitral valve leaflets. Trivial mitral valve regurgitation. No evidence of mitral valve stenosis. Tricuspid Valve: The tricuspid valve is normal in structure. Tricuspid valve regurgitation is not demonstrated. No evidence of tricuspid stenosis. Aortic Valve: The aortic valve is normal in structure. Aortic valve regurgitation is trivial. No aortic stenosis is present. Pulmonic Valve: The pulmonic valve was normal in structure. Pulmonic valve regurgitation is trivial. No evidence of pulmonic stenosis. Aorta: The aortic root is normal in size and structure. Aortic root measures 38 mm, which may be within normal limits for age when indexed to BSA. Venous: The inferior vena cava is normal in size with greater than 50% respiratory variability, suggesting right atrial pressure of 3 mmHg. IAS/Shunts: No atrial level shunt detected by color flow Doppler.  LEFT VENTRICLE PLAX 2D LVIDd:         4.60 cm LVIDs:         2.50 cm LV PW:         1.40 cm LV IVS:        1.20 cm LVOT diam:     2.20 cm LV SV:         80 LV SV Index:   36 LVOT Area:     3.80 cm  RIGHT VENTRICLE RV S prime:     17.60 cm/s TAPSE (M-mode): 3.1 cm LEFT ATRIUM             Index       RIGHT ATRIUM           Index LA diam:        2.70 cm 1.20 cm/m   RA Area:     19.60 cm LA Vol (A2C):   57.6 ml 25.63 ml/m RA Volume:   60.80 ml  27.05 ml/m LA Vol (A4C):   47.4 ml 21.09 ml/m LA Biplane Vol: 54.1 ml 24.07 ml/m  AORTIC VALVE LVOT Vmax:   79.00 cm/s LVOT Vmean:  61.700 cm/s LVOT VTI:    0.210 m  AORTA Ao Root diam: 3.80 cm Ao Asc diam:  3.40 cm  SHUNTS Systemic VTI:  0.21 m Systemic Diam: 2.20 cm Weston BrassGayatri Acharya MD Electronically signed by Weston BrassGayatri Acharya MD Signature Date/Time: 07/20/2019/3:32:08 PM    Final     (Echo, Carotid, EGD, Colonoscopy, ERCP)    Subjective: Patient resting in bed awake alert anxious to go home no further chest pain shortness of breath headaches reported  Discharge Exam: Vitals:   07/21/19 0336 07/21/19 1155  BP: (!) 143/105 (!) 157/106  Pulse: 81 65  Resp: 20 20  Temp: 97.6 F (36.4 C) 97.6 F (36.4 C)  SpO2: 98% 92%   Vitals:   07/20/19 2013 07/21/19 0335 07/21/19 0336 07/21/19 1155  BP: (!) 160/97  (!) 143/105 (!) 157/106  Pulse: 82  81 65  Resp: 18  20 20   Temp: 97.6 F (36.4 C)  97.6 F (36.4 C) 97.6 F (36.4 C)  TempSrc: Oral  Oral Oral  SpO2: 98%  98% 92%  Weight:  103.6 kg    Height:        General: Pt is alert, awake, not in acute distress Cardiovascular: RRR, S1/S2 +, no rubs, no gallops Respiratory: CTA bilaterally, no wheezing, no rhonchi Abdominal: Soft, NT, ND, bowel sounds + Extremities: no edema, no cyanosis    The results of significant diagnostics from this hospitalization (including imaging, microbiology, ancillary and laboratory) are listed below for reference.     Microbiology: Recent Results (from the past  240 hour(s))  SARS Coronavirus 2 by RT PCR (hospital order, performed in [facility] hospital lab) Nasopharyngeal Nasopharyngeal Swab     Status: None   Collection Time: 07/19/19  7:23 PM   Specimen: Nasopharyngeal Swab  Result Value Ref Range Status   SARS Coronavirus 2 NEGATIVE NEGATIVE Final    Comment: (NOTE) SARS-CoV-2 target nucleic acids are NOT  DETECTED.  The SARS-CoV-2 RNA is generally detectable in upper and lower respiratory specimens during the acute phase of infection. The lowest concentration of SARS-CoV-2 viral copies this assay can detect is 250 copies / mL. A negative result does not preclude SARS-CoV-2 infection and should not be used as the sole basis for treatment or other patient management decisions.  A negative result may occur with improper specimen collection / handling, submission of specimen other than nasopharyngeal swab, presence of viral mutation(s) within the areas targeted by this assay, and inadequate number of viral copies (<250 copies / mL). A negative result must be combined with clinical observations, patient history, and epidemiological information.  Fact Sheet for Patients:   StrictlyIdeas.no  Fact Sheet for Healthcare Providers: BankingDealers.co.za  This test is not yet approved or  cleared by the [place] FDA and has been authorized for detection and/or diagnosis of SARS-CoV-2 by FDA under an Emergency Use Authorization (EUA).  This EUA will remain in effect (meaning this test can be used) for the duration of the COVID-19 declaration under Section 564(b)(1) of the Act, 21 U.S.C. section 360bbb-3(b)(1), unless the authorization is terminated or revoked sooner.  Performed at [facility], [place] [address] [place], [place] [address]      Labs: BNP (last 3 results) No results for input(s): BNP in the last 8760 hours. Basic Metabolic Panel: Recent Labs  Lab 07/19/19 1017 07/19/19 2230  NA 141  --   K 3.9  --   CL 104  --   CO2 28  --   GLUCOSE 96  --   BUN 8  --   CREATININE 0.95 1.01  CALCIUM 9.3  --    Liver Function Tests: No results for input(s): AST, ALT, ALKPHOS, BILITOT, PROT, ALBUMIN in the last 168 hours. No results for input(s): LIPASE, AMYLASE in the last 168 hours. No results for input(s): AMMONIA in the last  168 hours. CBC: Recent Labs  Lab 07/19/19 1017 07/19/19 2230  WBC 7.1 7.7  HGB 16.8 15.9  HCT 52.0 49.0  MCV 88.9 88.8  PLT 237 213   Cardiac Enzymes: No results for input(s): CKTOTAL, CKMB, CKMBINDEX, TROPONINI in the last 168 hours. BNP: Invalid input(s): POCBNP CBG: No results for input(s): GLUCAP in the last 168 hours. D-Dimer No results for input(s): DDIMER in the last 72 hours. Hgb A1c No results for input(s): HGBA1C in the last 72 hours. Lipid Profile No results for input(s): CHOL, HDL, LDLCALC, TRIG, CHOLHDL, LDLDIRECT in the last 72 hours. Thyroid function studies No results for input(s): TSH, T4TOTAL, T3FREE, THYROIDAB in the last 72 hours.  Invalid input(s): FREET3 Anemia work up No results for input(s): VITAMINB12, FOLATE, FERRITIN, TIBC, IRON, RETICCTPCT in the last 72 hours. Urinalysis No results found for: COLORURINE, APPEARANCEUR, LABSPEC, [place], GLUCOSEU, [place], [place], [place], PROTEINUR, UROBILINOGEN, NITRITE, LEUKOCYTESUR Sepsis Labs Invalid input(s): PROCALCITONIN,  WBC,  LACTICIDVEN Microbiology Recent Results (from the past 240 hour(s))  SARS Coronavirus 2 by RT PCR (hospital order, performed in [facility] hospital lab) Nasopharyngeal Nasopharyngeal Swab     Status: None   Collection Time: 07/19/19  7:23 PM   Specimen: Nasopharyngeal Swab  Result Value Ref Range Status   SARS Coronavirus 2 NEGATIVE NEGATIVE Final    Comment: (NOTE) SARS-CoV-2 target nucleic acids are NOT DETECTED.  The SARS-CoV-2 RNA is generally detectable in upper and lower respiratory specimens during the acute phase of infection. The lowest concentration of SARS-CoV-2 viral copies this assay can detect is 250 copies / mL. A negative result does not preclude SARS-CoV-2 infection and should not be used as the sole basis for treatment or other patient management decisions.  A negative result may occur with improper specimen collection / handling, submission of  specimen other than nasopharyngeal swab, presence of viral mutation(s) within the areas targeted by this assay, and inadequate number of viral copies (<250 copies / mL). A negative result must be combined with clinical observations, patient history, and epidemiological information.  Fact Sheet for Patients:   BoilerBrush.com.cy  Fact Sheet for Healthcare Providers: https://pope.com/  This test is not yet approved or  cleared by the [place] FDA and has been authorized for detection and/or diagnosis of SARS-CoV-2 by FDA under an Emergency Use Authorization (EUA).  This EUA will remain in effect (meaning this test can be used) for the duration of the COVID-19 declaration under Section 564(b)(1) of the Act, 21 U.S.C. section 360bbb-3(b)(1), unless the authorization is terminated or revoked sooner.  Performed at [facility], [address], [place], [place] [address]      Time coordinating discharge: 39 minutes  SIGNED:   Alwyn Ren, MD  [facility] 07/21/2019, 12:24 PM Pager   If 7PM-7AM, please contact night-coverage [email or web address] Password [email or web address]

## 2019-07-21 NOTE — Progress Notes (Signed)
Progress Note  Patient Name: Manuel Mosley Date of Encounter: 07/21/2019  [facility] Cardiologist: Jacques Navy  Subjective   Feeling much improved now that his blood pressure is better controlled.  He has no chest pain or shortness of breath.  Ready to return home to his wife's today is his anniversary.  Coronary CT showed moderate coronary artery disease.  Inpatient Medications    Scheduled Meds: . amLODipine  5 mg Oral Daily  . aspirin EC  81 mg Oral Daily  . chlorthalidone  25 mg Oral Daily  . enoxaparin (LOVENOX) injection  40 mg Subcutaneous Q24H  . metoprolol tartrate  25 mg Oral BID   Continuous Infusions:  PRN Meds: acetaminophen, alum & mag hydroxide-simeth, hydrALAZINE, metoprolol tartrate, ondansetron (ZOFRAN) IV   Vital Signs    Vitals:   07/20/19 1847 07/20/19 2013 07/21/19 0335 07/21/19 0336  BP: (!) 175/113 (!) 160/97  (!) 143/105  Pulse: 68 82  81  Resp: 18 18  20   Temp: 98.4 F (36.9 C) 97.6 F (36.4 C)  97.6 F (36.4 C)  TempSrc: Oral Oral  Oral  SpO2: 98% 98%  98%  Weight:   103.6 kg   Height:        Intake/Output Summary (Last 24 hours) at 07/21/2019 0928 Last data filed at 07/20/2019 2200 Gross per 24 hour  Intake 240 ml  Output --  Net 240 ml   Last 3 Weights 07/21/2019 07/20/2019 07/19/2019  Weight (lbs) 228 lb 4.8 oz 232 lb 6.4 oz 234 lb  Weight (kg) 103.556 kg 105.416 kg 106.142 kg      Telemetry    Sinus rhythm with PVCs- Personally Reviewed  ECG    None new- Personally Reviewed  Physical Exam   GEN: No acute distress.   Neck: No JVD Cardiac: RRR, no murmurs, rubs, or gallops.  Respiratory: Clear to auscultation bilaterally. GI: Soft, nontender, non-distended  MS: No edema; No deformity. Neuro:  Nonfocal  Psych: Normal affect   Labs    High Sensitivity Troponin:   Recent Labs  Lab 07/19/19 1017 07/19/19 1629  TROPONINIHS 35* 33*      Chemistry Recent Labs  Lab 07/19/19 1017 07/19/19 2230  NA 141  --   K  3.9  --   CL 104  --   CO2 28  --   GLUCOSE 96  --   BUN 8  --   CREATININE 0.95 1.01  CALCIUM 9.3  --   GFRNONAA >60 >60  GFRAA >60 >60  ANIONGAP 9  --      Hematology Recent Labs  Lab 07/19/19 1017 07/19/19 2230  WBC 7.1 7.7  RBC 5.85* 5.52  HGB 16.8 15.9  HCT 52.0 49.0  MCV 88.9 88.8  MCH 28.7 28.8  MCHC 32.3 32.4  RDW 13.2 13.3  PLT 237 213    BNPNo results for input(s): BNP, PROBNP in the last 168 hours.   DDimer No results for input(s): DDIMER in the last 168 hours.   Radiology    DG Chest 2 View  Result Date: 07/19/2019 CLINICAL DATA:  Chest pain. EXAM: CHEST - 2 VIEW COMPARISON:  None. FINDINGS: The heart size and mediastinal contours are within normal limits. Both lungs are clear. No pneumothorax or pleural effusion is noted. The visualized skeletal structures are unremarkable. IMPRESSION: No active cardiopulmonary disease. Electronically Signed   By: 07/21/2019 M.D.   On: 07/19/2019 10:43   CT CORONARY MORPH W/CTA COR W/SCORE W/CA W/CM &/OR  WO/CM  Result Date: 07/20/2019 CLINICAL DATA:  49 year old male with h/o smoking who presented with hypertensive urgency, mildly elevated troponin and chest pain. EXAM: Cardiac/Coronary  CTA TECHNIQUE: The patient was scanned on a [employer]. FINDINGS: A 100 kV prospective scan was triggered in the descending thoracic aorta at 111 HU's. Axial non-contrast 3 mm slices were carried out through the heart. The data set was analyzed on a dedicated work station and scored using the [place]. Gantry rotation speed was 250 msecs and collimation was .6 mm. 25 mg of PO Metoprolol and 0.8 mg of sl NTG were given. The 3D data set was reconstructed in 5% intervals of the 67-82 % of the R-R cycle. Diastolic phases were analyzed on a dedicated work station using MPR, MIP and VRT modes. The patient received 80 cc of contrast. Aorta: Mild ascending aortic aneurysm measuring 44 mm. Trvial atherosclerotic plaque, no  calcifications, no dissection. Aortic Valve:  Trileaflet.  No calcifications. Coronary Arteries:  Normal coronary origin.  Right dominance. RCA is a medium size dominant artery that gives rise to PDA, PLA is not visualized. There is mild diffuse mixed plaque in the mid and distal RCA with stenosis 25-49% and a focal stenosis of 50-69% in the distal portion. Left main is a large artery that gives rise to LAD and LCX arteries. Left main has no plaque. LAD is a large vessel that has moderate mixed plaque in the proximal portion with stenosis 50-69% and another focal moderate non-calcified plaque in the mid LAD with stenosis 50-69%. LAD gives rise to one diagonal artery that appears occluded in its proximal portion. LCX is a small non-dominant artery that gives rise to one large OM1 branch. OM1 has a moderate calcified plaque in the ostial/proximal portion with stenosis 50-69%. Other findings: Normal pulmonary vein drainage into the left atrium. Normal left atrial appendage without a thrombus. Normal size of the pulmonary artery. IMPRESSION: 1. Coronary calcium score of 270. This was 47 percentile for age and sex matched control. 2. Normal coronary origin with right dominance. 3. CAD-RADS 3. Moderate stenosis in the proximal and mid LAD, ostial portion of OM1 and distal RCA. Consider symptom-guided anti-ischemic pharmacotherapy as well as risk factor modification per guideline directed care. Additional analysis with CT FFR Ernest Popowski be submitted. Electronically Signed   By: Ena Dawley   On: 07/20/2019 17:14   ECHOCARDIOGRAM COMPLETE  Result Date: 07/20/2019    ECHOCARDIOGRAM REPORT   Patient Name:   Manuel Mosley Date of Exam: 07/20/2019 Medical Rec #:  [id number]     Height:       71.0 in Accession #:    [id number]    Weight:       232.4 lb Date of Birth:  23-Oct-1970     BSA:          2.247 m Patient Age:    49 years      BP:           158/109 mmHg Patient Gender: M             HR:           71 bpm. Exam Location:   Inpatient Procedure: 2D Echo, Color Doppler and Cardiac Doppler Indications:    R07.9* Chest pain, unspecified; R94.31 Abnormal EKG; Elevated                 Troponins  History:        Patient has no prior history of Echocardiogram  examinations.                 Risk Factors:Hypertension.  Sonographer:    Irving Burton Senior RDCS Referring Phys: [id number] Parke Poisson IMPRESSIONS  1. Left ventricular ejection fraction, by estimation, is 60 to 65%. The left ventricle has normal function. Left ventricular endocardial border not optimally defined to evaluate regional wall motion. There is mild left ventricular hypertrophy. Left ventricular diastolic parameters were normal.  2. Right ventricular systolic function is normal. The right ventricular size is normal. Tricuspid regurgitation signal is inadequate for assessing PA pressure.  3. The mitral valve is normal in structure. Trivial mitral valve regurgitation. No evidence of mitral stenosis.  4. The aortic valve is normal in structure. Aortic valve regurgitation is trivial. No aortic stenosis is present.  5. The inferior vena cava is normal in size with greater than 50% respiratory variability, suggesting right atrial pressure of 3 mmHg.  6. No evidence of coarctation of the aorta. FINDINGS  Left Ventricle: Left ventricular ejection fraction, by estimation, is 60 to 65%. The left ventricle has normal function. Left ventricular endocardial border not optimally defined to evaluate regional wall motion. The left ventricular internal cavity size was normal in size. There is mild left ventricular hypertrophy. Left ventricular diastolic parameters were normal. Right Ventricle: The right ventricular size is normal. No increase in right ventricular wall thickness. Right ventricular systolic function is normal. Tricuspid regurgitation signal is inadequate for assessing PA pressure. Left Atrium: Left atrial size was normal in size. Right Atrium: Right atrial size was normal in size.  Pericardium: There is no evidence of pericardial effusion. Presence of pericardial fat pad. Mitral Valve: The mitral valve is normal in structure. Normal mobility of the mitral valve leaflets. Trivial mitral valve regurgitation. No evidence of mitral valve stenosis. Tricuspid Valve: The tricuspid valve is normal in structure. Tricuspid valve regurgitation is not demonstrated. No evidence of tricuspid stenosis. Aortic Valve: The aortic valve is normal in structure. Aortic valve regurgitation is trivial. No aortic stenosis is present. Pulmonic Valve: The pulmonic valve was normal in structure. Pulmonic valve regurgitation is trivial. No evidence of pulmonic stenosis. Aorta: The aortic root is normal in size and structure. Aortic root measures 38 mm, which may be within normal limits for age when indexed to BSA. Venous: The inferior vena cava is normal in size with greater than 50% respiratory variability, suggesting right atrial pressure of 3 mmHg. IAS/Shunts: No atrial level shunt detected by color flow Doppler.  LEFT VENTRICLE PLAX 2D LVIDd:         4.60 cm LVIDs:         2.50 cm LV PW:         1.40 cm LV IVS:        1.20 cm LVOT diam:     2.20 cm LV SV:         80 LV SV Index:   36 LVOT Area:     3.80 cm  RIGHT VENTRICLE RV S prime:     17.60 cm/s TAPSE (M-mode): 3.1 cm LEFT ATRIUM             Index       RIGHT ATRIUM           Index LA diam:        2.70 cm 1.20 cm/m  RA Area:     19.60 cm LA Vol (A2C):   57.6 ml 25.63 ml/m RA Volume:   60.80 ml  27.05 ml/m  LA Vol (A4C):   47.4 ml 21.09 ml/m LA Biplane Vol: 54.1 ml 24.07 ml/m  AORTIC VALVE LVOT Vmax:   79.00 cm/s LVOT Vmean:  61.700 cm/s LVOT VTI:    0.210 m  AORTA Ao Root diam: 3.80 cm Ao Asc diam:  3.40 cm  SHUNTS Systemic VTI:  0.21 m Systemic Diam: 2.20 cm Weston Brass MD Electronically signed by Weston Brass MD Signature Date/Time: 07/20/2019/3:32:08 PM    Final     Cardiac Studies   TTE 07/21/19 1. Left ventricular ejection fraction, by  estimation, is 60 to 65%. The  left ventricle has normal function. Left ventricular endocardial border  not optimally defined to evaluate regional wall motion. There is mild left  ventricular hypertrophy. Left  ventricular diastolic parameters were normal.  2. Right ventricular systolic function is normal. The right ventricular  size is normal. Tricuspid regurgitation signal is inadequate for assessing  PA pressure.  3. The mitral valve is normal in structure. Trivial mitral valve  regurgitation. No evidence of mitral stenosis.  4. The aortic valve is normal in structure. Aortic valve regurgitation is  trivial. No aortic stenosis is present.  5. The inferior vena cava is normal in size with greater than 50%  respiratory variability, suggesting right atrial pressure of 3 mmHg.  6. No evidence of coarctation of the aorta.   Coronary CTA 1. Coronary calcium score of 270. This was 33 percentile for age and sex matched control.  2. Normal coronary origin with right dominance.  3. CAD-RADS 3. Moderate stenosis in the proximal and mid LAD, ostial portion of OM1 and distal RCA. Consider symptom-guided anti-ischemic pharmacotherapy as well as risk factor modification per guideline directed care. Additional analysis with CT FFR Katurah Karapetian be submitted.  Patient Profile     49 y.o. male with a history of tobacco abuse and hypertension who presented to the hospital with chest pain and an abnormal ECG  Assessment & Plan    1.  Hypertensive urgency: Patient has been out of his medications.  Blood pressure is better controlled, though certainly not at goal.  We Orilla Templeman increase him his amlodipine to 5 mg and switch him from metoprolol to carvedilol.  He can have follow-up in cardiology clinic which Aulton Routt be arranged.  2.  Coronary artery disease: Found on coronary CTA.  FFR is currently pending.  This is nonobstructive and should not keep him in the hospital.  He Nerida Boivin need aggressive risk factor  modification including an LDL less than 70.  We Tangi Shroff start him on Crestor 40 mg.  3.  Tobacco abuse: Counseled on complete cessation.  [facility] Toren Tucholski sign off.   Medication Recommendations: Carvedilol 6.25 mg, amlodipine 10 mg, chlorthalidone 25 mg, Crestor 40 mg Other recommendations (labs, testing, etc): Outpatient fasting lipids to be arranged Follow up as an outpatient: Rakeen Gaillard be arranged in cardiology clinic  For questions or updates, please contact [facility] Please consult [email or web address] for contact info under        Signed, Vallie Teters Jorja Loa, MD  07/21/2019, 9:28 AM

## 2019-07-27 ENCOUNTER — Telehealth: Payer: Self-pay | Admitting: *Deleted

## 2019-07-27 DIAGNOSIS — I77819 Aortic ectasia, unspecified site: Secondary | ICD-10-CM

## 2019-07-27 DIAGNOSIS — I77810 Thoracic aortic ectasia: Secondary | ICD-10-CM

## 2019-07-27 NOTE — Telephone Encounter (Signed)
The patient has been notified of the result and verbalized understanding.  All questions (if any) were answered. Patient aware to keep appointment 08/08/19  Ordered placed for CTA of aorta for 2022 Tobin Chad, RN 07/27/2019 2:30 PM

## 2019-07-27 NOTE — Telephone Encounter (Signed)
-----   Message from Parke Poisson, MD sent at 07/23/2019  9:48 AM EDT ----- Pt has follow up with me soon. Please arrange a CTA aorta in 1 year for follow up ascending aorta dilation.

## 2019-08-08 ENCOUNTER — Ambulatory Visit: Payer: Managed Care, Other (non HMO) | Admitting: Internal Medicine

## 2019-08-09 ENCOUNTER — Encounter: Payer: Self-pay | Admitting: Internal Medicine

## 2019-08-09 ENCOUNTER — Telehealth (INDEPENDENT_AMBULATORY_CARE_PROVIDER_SITE_OTHER): Payer: Managed Care, Other (non HMO) | Admitting: Internal Medicine

## 2019-08-09 VITALS — BP 118/83 | Ht 71.0 in | Wt 230.0 lb

## 2019-08-09 DIAGNOSIS — E7800 Pure hypercholesterolemia, unspecified: Secondary | ICD-10-CM | POA: Diagnosis not present

## 2019-08-09 DIAGNOSIS — R079 Chest pain, unspecified: Secondary | ICD-10-CM

## 2019-08-09 DIAGNOSIS — I77810 Thoracic aortic ectasia: Secondary | ICD-10-CM | POA: Diagnosis not present

## 2019-08-09 DIAGNOSIS — I160 Hypertensive urgency: Secondary | ICD-10-CM | POA: Diagnosis not present

## 2019-08-09 DIAGNOSIS — R0683 Snoring: Secondary | ICD-10-CM

## 2019-08-09 MED ORDER — LOSARTAN POTASSIUM 25 MG PO TABS
25.0000 mg | ORAL_TABLET | Freq: Every day | ORAL | 3 refills | Status: AC
Start: 2019-08-09 — End: ?

## 2019-08-09 MED ORDER — ROSUVASTATIN CALCIUM 40 MG PO TABS: 40.0000 mg | ORAL_TABLET | Freq: Every day | ORAL | 3 refills | Status: AC

## 2019-08-09 NOTE — Patient Instructions (Addendum)
Medication Instructions:   Your physician has recommended you make the following change in your medication:   1) Stop Carvedilol  2) Start Losartan 25 mg, 1 tablet by mouth once a day  *If you need a refill on your cardiac medications before your next appointment, please call your pharmacy*  Lab Work:  Your physician recommends that you return for lab work in 7-10 days  If you have labs (blood work) drawn today and your tests are completely normal, you will receive your results only by: Marland Kitchen MyChart Message (if you have MyChart) OR . A paper copy in the mail If you have any lab test that is abnormal or we need to change your treatment, we will call you to review the results.  Testing/Procedures:  Your physician has recommended that you have a sleep study. This test records several body functions during sleep, including: brain activity, eye movement, oxygen and carbon dioxide blood levels, heart rate and rhythm, breathing rate and rhythm, the flow of air through your mouth and nose, snoring, body muscle movements, and chest and belly movement.  Follow-Up:  On 09/20/19 at 8:20AM with Weston Brass

## 2019-08-09 NOTE — Progress Notes (Signed)
Virtual Visit via Video Note   This visit type was conducted due to national recommendations for restrictions regarding the COVID-19 Pandemic (e.g. social distancing) in an effort to limit this patient's exposure and mitigate transmission in our community.  Due to his co-morbid illnesses, this patient is at least at moderate risk for complications without adequate follow up.  This format is felt to be most appropriate for this patient at this time.  All issues noted in this document were discussed and addressed.  A limited physical exam was performed with this format.  Please refer to the patient's chart for his consent to telehealth for [facility].      The patient was identified using 2 identifiers.  Date:  08/09/2019   ID:  Manuel Mosley, DOB [birth year], MRN [id number]  Patient Location: Home Provider Location: Home Office  PCP:  Patient, No Pcp Per  Cardiologist:  Parke Poisson, MD  Electrophysiologist:  None   Evaluation Performed:  Follow-Up Visit  Chief Complaint:  Cp, htn  History of Present Illness:    Manuel Mosley is a 49 y.o. male with tobacco abuse and hypertension who presented to the hospital with chest pain and an abnormal ECG, found to have moderate, nonobstructive CAD and hypertensive urgency. He was dismissed from the hospital on coreg, amlodipine, chlorthalidone, and crestor.   BP: 120/83 on average p 77  No recurrent chest pain. No SOB. Endorses apneic episodes overnight, we discussed needing a sleep study.   He is not tolerating carvedilol, we will transition.   The patient does not have symptoms concerning for COVID-19 infection (fever, chills, cough, or new shortness of breath).    Past Medical History:  Diagnosis Date  . Hypertension   . Kidney stones    Past Surgical History:  Procedure Laterality Date  . TOE SURGERY Right      Current Meds  Medication Sig  . amLODipine (NORVASC) 5 MG tablet Take 2 tablets (10 mg total) by mouth  daily.  Marland Kitchen aspirin EC 81 MG EC tablet Take 1 tablet (81 mg total) by mouth daily. Swallow whole.  . carvedilol (COREG) 12.5 MG tablet Take 1 tablet (12.5 mg total) by mouth 2 (two) times daily.  . chlorthalidone (HYGROTON) 25 MG tablet Take 1 tablet (25 mg total) by mouth daily.  . rosuvastatin (CRESTOR) 20 MG tablet Take 2 tablets (40 mg total) by mouth daily.     Allergies:   Patient has no known allergies.   Social History   Tobacco Use  . Smoking status: Current Every Day Smoker    Packs/day: 1.00    Types: Cigarettes  . Smokeless tobacco: Never Used  Substance Use Topics  . Alcohol use: Yes    Comment: occ  . Drug use: Yes    Types: Marijuana     Family Hx: The patient's family history includes Breast cancer in his mother; Cancer in his father; Diabetes Mellitus II in his mother.  ROS:   Please see the history of present illness.     All other systems reviewed and are negative.   Prior CV studies:   The following studies were reviewed today:    Labs/Other Tests and Data Reviewed:    EKG:  No ECG reviewed.  Recent Labs: 07/19/2019: BUN 8; Creatinine, Ser 1.01; Hemoglobin 15.9; Platelets 213; Potassium 3.9; Sodium 141   Recent Lipid Panel No results found for: CHOL, TRIG, HDL, CHOLHDL, LDLCALC, LDLDIRECT  Wt Readings from Last 3 Encounters:  08/09/19 230  lb (104.3 kg)  07/21/19 228 lb 4.8 oz (103.6 kg)  07/19/19 234 lb (106.1 kg)     Objective:    Vital Signs:  BP 118/83   Ht 5\' 11"  (1.803 m)   Wt 230 lb (104.3 kg)   BMI 32.08 kg/m    VITAL SIGNS:  reviewed GEN:  no acute distress EYES:  sclerae anicteric, EOMI - Extraocular Movements Intact RESPIRATORY:  normal respiratory effort, symmetric expansion CARDIOVASCULAR:  no peripheral edema SKIN:  no rash, lesions or ulcers. MUSCULOSKELETAL:  no obvious deformities. NEURO:  alert and oriented x 3, no obvious focal deficit PSYCH:  normal affect    ASSESSMENT & PLAN:    1. Hypertensive urgency     2. Ascending aorta dilation (HCC)   3. Chest pain, unspecified type   4. Hypercholesterolemia   5. Snoring    HTN - Will stop coreg - not tolerated - and will start losartan 25 mg daily. Continue chlorthalidone and amlodipine. Check BMP in 7-10 days.   Chest pain - no recurrence, likely related to moderate CAD in setting of HTN.  HLD - continue crestor 40 mg  Snoring/apnea - sleep study needed  CAD - ASA 81 mg daily and statin.   Ascending aorta dilation - will rescan in 1 year with CTA.   COVID-19 Education: The signs and symptoms of COVID-19 were discussed with the patient and how to seek care for testing (follow up with PCP or arrange E-visit).  The importance of social distancing was discussed today.  Time:   Today, I have spent 25 minutes with the patient with telehealth technology discussing the above problems.     Medication Adjustments/Labs and Tests Ordered: Current medicines are reviewed at length with the patient today.  Concerns regarding medicines are outlined above.   Tests Ordered: Orders Placed This Encounter  Procedures  . Comprehensive metabolic panel  . Lipid panel  . Ambulatory referral to Sleep Studies  . Split night study    Medication Changes: Meds ordered this encounter  Medications  . losartan (COZAAR) 25 MG tablet    Sig: Take 1 tablet (25 mg total) by mouth daily.    Dispense:  90 tablet    Refill:  3  . rosuvastatin (CRESTOR) 40 MG tablet    Sig: Take 1 tablet (40 mg total) by mouth daily.    Dispense:  90 tablet    Refill:  3    Follow Up: 1 mo Signed, 9-10, MD  08/09/2019 9:00 AM    [facility]

## 2019-08-15 ENCOUNTER — Ambulatory Visit: Payer: Managed Care, Other (non HMO) | Admitting: Medical

## 2019-09-10 ENCOUNTER — Encounter: Payer: Self-pay | Admitting: Internal Medicine

## 2019-09-20 ENCOUNTER — Telehealth: Payer: Managed Care, Other (non HMO) | Admitting: Internal Medicine

## 2019-10-19 ENCOUNTER — Other Ambulatory Visit: Payer: Self-pay | Admitting: Internal Medicine

## 2019-10-19 ENCOUNTER — Telehealth: Payer: Self-pay | Admitting: *Deleted

## 2019-10-19 DIAGNOSIS — R0683 Snoring: Secondary | ICD-10-CM

## 2019-10-19 NOTE — Telephone Encounter (Signed)
Left message to check MyChart for sleep study appointment. If he has any questions please call office.

## 2019-10-19 NOTE — Telephone Encounter (Signed)
Since patient not available message left to check his Mychart account for sleep study appointment details.

## 2019-12-05 ENCOUNTER — Ambulatory Visit (HOSPITAL_BASED_OUTPATIENT_CLINIC_OR_DEPARTMENT_OTHER): Payer: Managed Care, Other (non HMO)

## 2019-12-05 ENCOUNTER — Other Ambulatory Visit: Payer: Self-pay

## 2019-12-05 DIAGNOSIS — R0683 Snoring: Secondary | ICD-10-CM

## 2019-12-10 ENCOUNTER — Other Ambulatory Visit: Payer: Self-pay

## 2019-12-10 ENCOUNTER — Ambulatory Visit (HOSPITAL_BASED_OUTPATIENT_CLINIC_OR_DEPARTMENT_OTHER): Payer: Managed Care, Other (non HMO) | Attending: Internal Medicine | Admitting: Cardiovascular Disease

## 2019-12-10 DIAGNOSIS — G4736 Sleep related hypoventilation in conditions classified elsewhere: Secondary | ICD-10-CM | POA: Diagnosis not present

## 2019-12-10 DIAGNOSIS — I160 Hypertensive urgency: Secondary | ICD-10-CM | POA: Diagnosis not present

## 2019-12-10 DIAGNOSIS — R0683 Snoring: Secondary | ICD-10-CM

## 2019-12-10 DIAGNOSIS — G4733 Obstructive sleep apnea (adult) (pediatric): Secondary | ICD-10-CM | POA: Diagnosis not present

## 2019-12-23 ENCOUNTER — Encounter (HOSPITAL_BASED_OUTPATIENT_CLINIC_OR_DEPARTMENT_OTHER): Payer: Self-pay | Admitting: Cardiovascular Disease

## 2019-12-23 NOTE — Procedures (Signed)
    Patient Name: Manuel Mosley, Manuel Mosley Date: 12/10/2019 Gender: Male D.O.B: 11-Dec-1970 Age (years): 63 Referring Provider: Weston Brass MD Height (inches): 71 Interpreting Physician: Nicki Guadalajara MD, ABSM Weight (lbs): 242 RPSGT: [place] Sink BMI: 34 MRN: [id number] Neck Size: 18.50  CLINICAL INFORMATION Sleep Study Type: HST  Indication for sleep study: snoring, apnea  Epworth Sleepiness Score: 7  SLEEP STUDY TECHNIQUE A multi-channel overnight portable sleep study was performed. The channels recorded were: nasal airflow, thoracic respiratory movement, and oxygen saturation with a pulse oximetry. Snoring was also monitored.  MEDICATIONS aspirin EC 81 MG EC tablet chlorthalidone (HYGROTON) 25 MG tablet losartan (COZAAR) 25 MG tablet rosuvastatin (CRESTOR) 40 MG tablet Patient self administered medications include: N/A.  SLEEP ARCHITECTURE Patient was studied for 429 minutes. The sleep efficiency was 100.0 % and the patient was supine for 0.1%. The arousal index was 0.0 per hour.  RESPIRATORY PARAMETERS The overall AHI was 43.1 per hour, with a central apnea index of 0.0 per hour.   The oxygen nadir was 64% during sleep.  CARDIAC DATA Mean heart rate during sleep was 69.7 bpm.  IMPRESSIONS - Severe obstructive sleep apnea occurred during this study (AHI 43.1/h). Supine sleep only occurred for 0.3 minutes.  - No significant central sleep apnea occurred during this study (CAI = 0.0/h). - Severe oxygen desaturation to a nadir of 64%. - Patient snored 14.7% during the sleep.  DIAGNOSIS - Obstructive Sleep Apnea (G47.33) - Nocturnal Hypoxemia (G47.36)  RECOMMENDATIONS - In this patient with significant cardiovascular co-morbidities and severe sleep apnea with marked oxygen desaturation to a nadir of 64% recommend an in-lab CPAP titration to optimally further evaluate and treat his severe sleep diordered breathing. - Effort shoud be made to optimize nasal  and oropharyngeal patency. - Avoid alcohol, sedatives and other CNS depressants that may worsen sleep apnea and disrupt normal sleep architecture. - Sleep hygiene should be reviewed to assess factors that may improve sleep quality. - Weight management and regular exercise should be initiated or continued. - Recommend a download after initiation of CPAP/BiPAP therapy and sleep clinic evaluation.   [Electronically signed] 12/23/2019 07:31 PM  Nicki Guadalajara MD, [facility], ABSM Diplomate, American Board of Sleep Medicine   NPI: [id number] [facility] PH: [phone]   FX: [phone] ACCREDITED BY THE AMERICAN ACADEMY OF SLEEP MEDICINE

## 2019-12-25 ENCOUNTER — Telehealth: Payer: Self-pay | Admitting: *Deleted

## 2019-12-25 DIAGNOSIS — G4733 Obstructive sleep apnea (adult) (pediatric): Secondary | ICD-10-CM

## 2019-12-25 NOTE — Telephone Encounter (Addendum)
Informed patient of sleep study results and patient understanding was verbalized. Patient understands her sleep study showed   DIAGNOSIS - Obstructive Sleep Apnea (G47.33) - Nocturnal Hypoxemia (G47.36)  RECOMMENDATIONS  recommend an in-lab CPAP titration  Titration sent to sleep pool  Left message on voicemail and informed patient to call back for his ss results.

## 2019-12-25 NOTE — Addendum Note (Signed)
Addended by: Reesa Chew on: 12/25/2019 06:47 PM   Modules accepted: Orders

## 2019-12-31 ENCOUNTER — Telehealth: Payer: Self-pay | Admitting: Cardiovascular Disease

## 2019-12-31 NOTE — Telephone Encounter (Signed)
Pre-auth request for cpap titration has been faxed into [employer]

## 2020-02-06 ENCOUNTER — Telehealth: Payer: Self-pay | Admitting: Cardiovascular Disease

## 2020-02-06 NOTE — Telephone Encounter (Signed)
PA clinicals were not received for cpap titration.  Have resent these.  Case # [id number]

## 2020-02-08 NOTE — Telephone Encounter (Signed)
Cpap titration was denied based off clinicals not deeming it medically necessary.  How would you like to proceed?

## 2020-07-07 ENCOUNTER — Telehealth: Payer: Self-pay | Admitting: Internal Medicine

## 2020-07-07 NOTE — Telephone Encounter (Signed)
Left message for patient to call and discuss scheduling the CTA chest/aorta ordered by Dr Acharya. 

## 2020-07-09 NOTE — Telephone Encounter (Signed)
Patient and I were discussing preferred scheduling weekdays and times for the CTA chest/aorta ordered by Dr. Jacques Navy and the call dropped  Called and left message requesting he call me.

## 2020-07-11 NOTE — Telephone Encounter (Signed)
Lweft message for patient to call and discuss scheduling the CTA chest/aorta ordered by Dr, Jacques Navy

## 2020-07-14 NOTE — Telephone Encounter (Signed)
Left message for patient to call and discuss scheduling the CTA chest/aorta ordered by Dr Acharya. 

## 2020-07-16 NOTE — Telephone Encounter (Signed)
Left message for patient to call and discuss scheduling the CTA chest/aorta ordered by Dr. Eilleen Kempf also send My Chart message

## 2022-03-22 IMAGING — CR DG CHEST 2V
2 series · 2 of 2 positions shown · non-contrast
Comparison: None.

CLINICAL DATA: Chest pain.

EXAM:
CHEST - 2 VIEW

[chest pa]
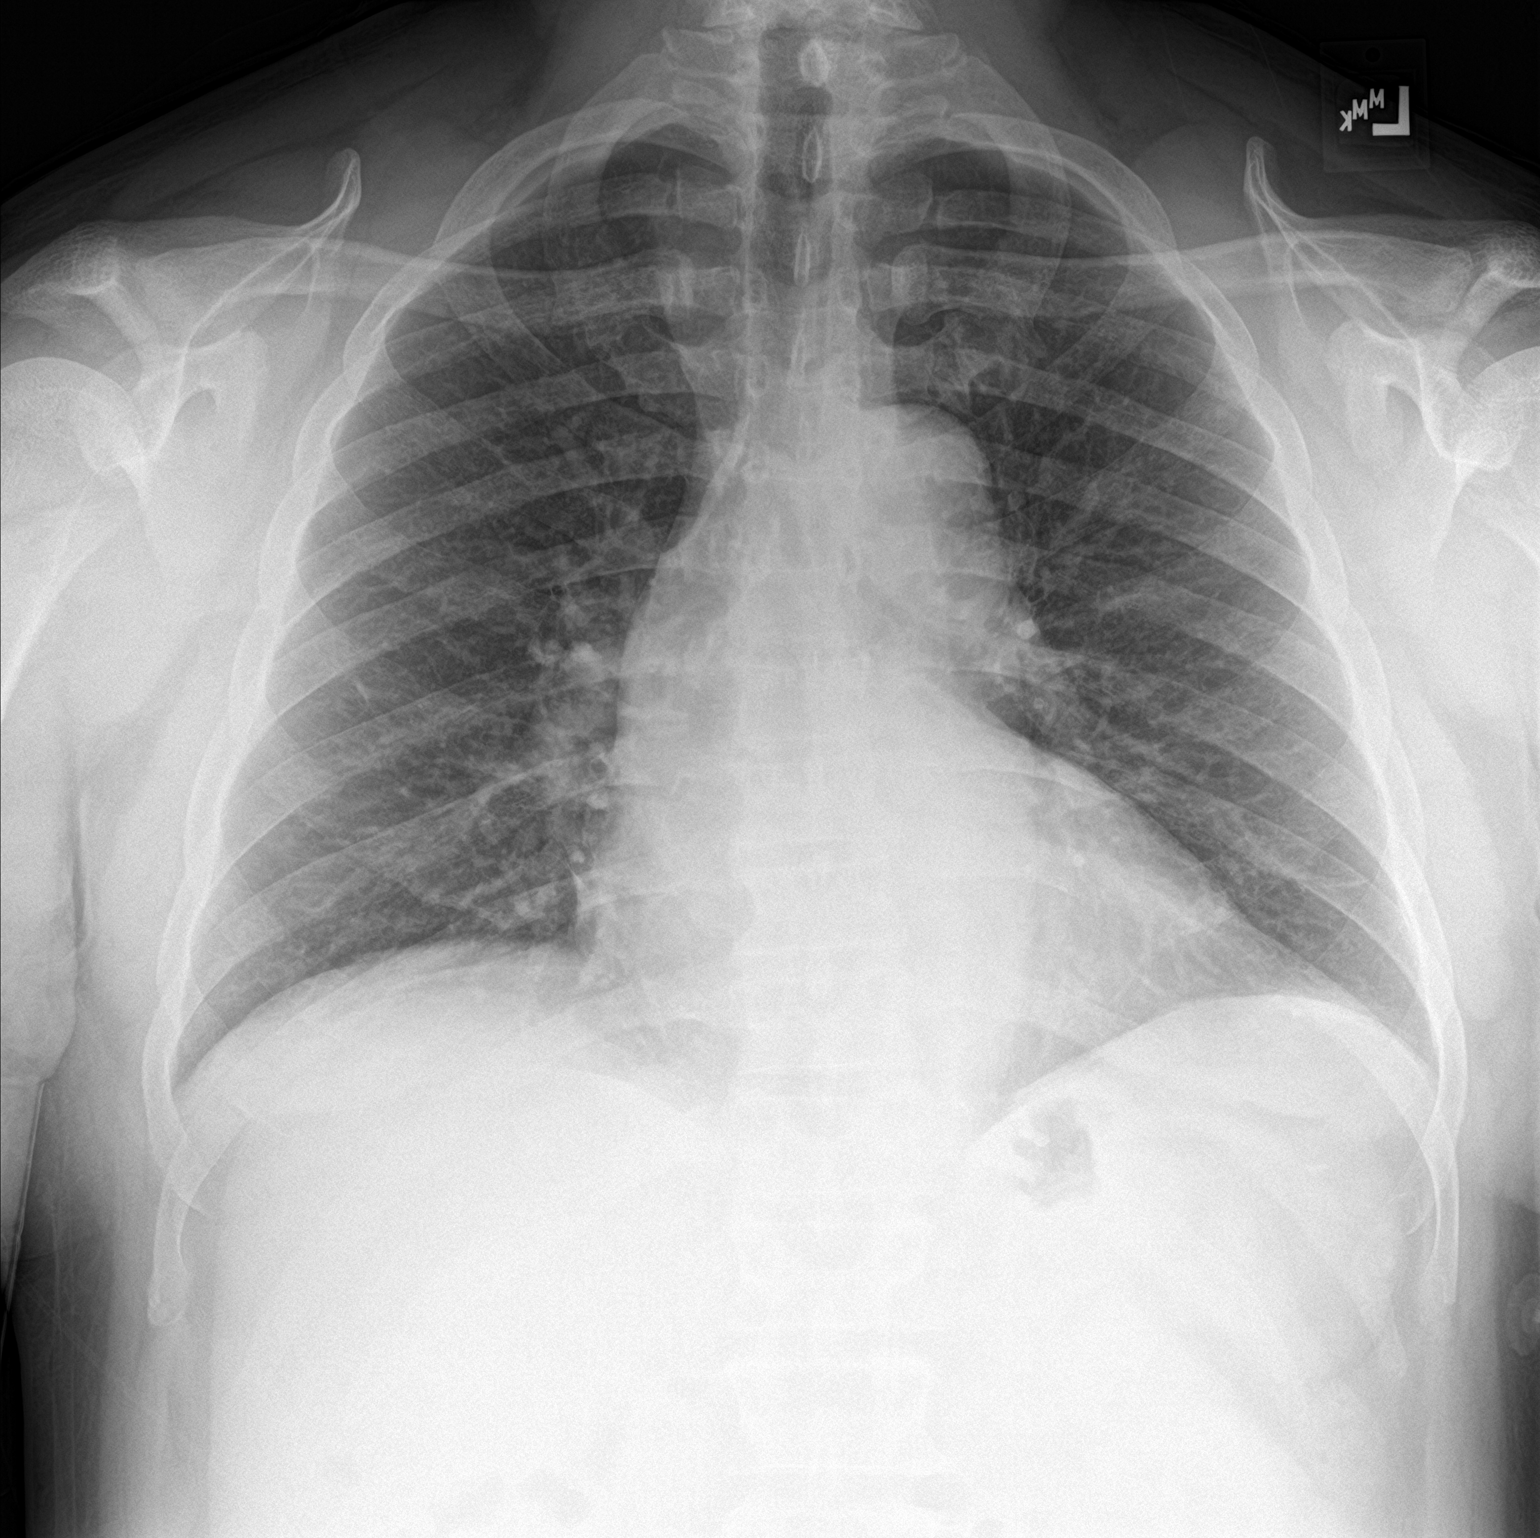

[chest lat]
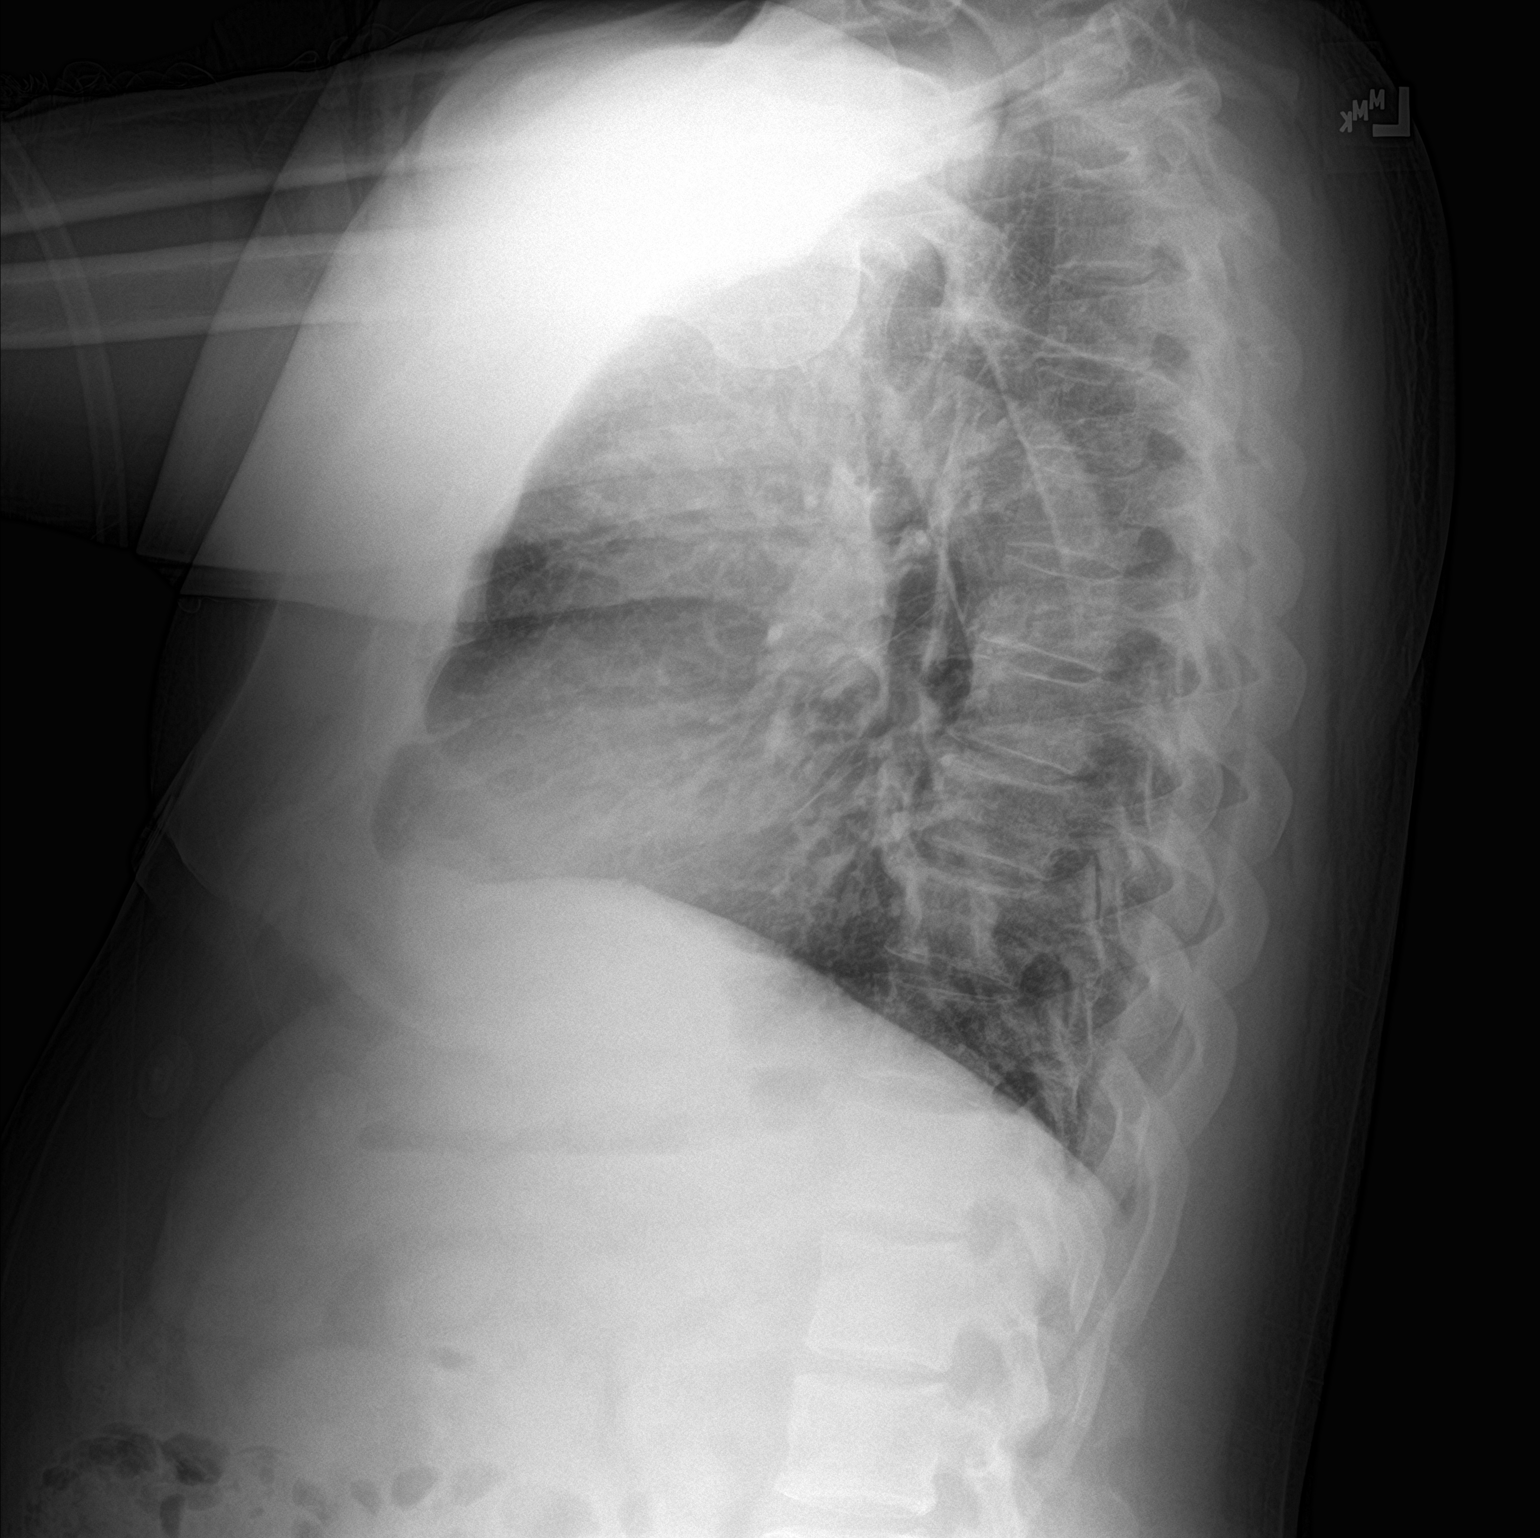

[2 of 2 positions shown; findings below may reference images not displayed]

FINDINGS: The heart size and mediastinal contours are within normal limits.
Both lungs are clear. No pneumothorax or pleural effusion is noted.
The visualized skeletal structures are unremarkable.
IMPRESSION: No active cardiopulmonary disease.
